# Patient Record
Sex: Female | Born: 1935 | Race: White | Hispanic: No | Smoking: Never smoker
Health system: Southern US, Community
[De-identification: ages and names within clinical notes are randomized; demographics above are authoritative.]

## PROBLEM LIST (undated history)

## (undated) DIAGNOSIS — I82409 Acute embolism and thrombosis of unspecified deep veins of unspecified lower extremity: Secondary | ICD-10-CM

## (undated) DIAGNOSIS — I739 Peripheral vascular disease, unspecified: Secondary | ICD-10-CM

## (undated) DIAGNOSIS — D493 Neoplasm of unspecified behavior of breast: Secondary | ICD-10-CM

## (undated) DIAGNOSIS — I447 Left bundle-branch block, unspecified: Secondary | ICD-10-CM

## (undated) DIAGNOSIS — M8580 Other specified disorders of bone density and structure, unspecified site: Secondary | ICD-10-CM

## (undated) DIAGNOSIS — E785 Hyperlipidemia, unspecified: Secondary | ICD-10-CM

## (undated) DIAGNOSIS — K635 Polyp of colon: Secondary | ICD-10-CM

## (undated) DIAGNOSIS — K2970 Gastritis, unspecified, without bleeding: Secondary | ICD-10-CM

## (undated) DIAGNOSIS — K209 Esophagitis, unspecified without bleeding: Secondary | ICD-10-CM

## (undated) DIAGNOSIS — E559 Vitamin D deficiency, unspecified: Secondary | ICD-10-CM

## (undated) DIAGNOSIS — G8929 Other chronic pain: Secondary | ICD-10-CM

## (undated) DIAGNOSIS — R768 Other specified abnormal immunological findings in serum: Secondary | ICD-10-CM

## (undated) DIAGNOSIS — G4733 Obstructive sleep apnea (adult) (pediatric): Secondary | ICD-10-CM

## (undated) DIAGNOSIS — I10 Essential (primary) hypertension: Secondary | ICD-10-CM

## (undated) DIAGNOSIS — M159 Polyosteoarthritis, unspecified: Secondary | ICD-10-CM

## (undated) DIAGNOSIS — Z7901 Long term (current) use of anticoagulants: Secondary | ICD-10-CM

## (undated) DIAGNOSIS — E7800 Pure hypercholesterolemia, unspecified: Secondary | ICD-10-CM

## (undated) DIAGNOSIS — E782 Mixed hyperlipidemia: Secondary | ICD-10-CM

## (undated) DIAGNOSIS — D509 Iron deficiency anemia, unspecified: Secondary | ICD-10-CM

## (undated) DIAGNOSIS — M1711 Unilateral primary osteoarthritis, right knee: Secondary | ICD-10-CM

## (undated) DIAGNOSIS — Z79899 Other long term (current) drug therapy: Secondary | ICD-10-CM

## (undated) DIAGNOSIS — K5732 Diverticulitis of large intestine without perforation or abscess without bleeding: Secondary | ICD-10-CM

## (undated) HISTORY — DX: Peripheral vascular disease, unspecified: I73.9

## (undated) HISTORY — DX: Polyp of colon: K63.5

## (undated) HISTORY — DX: Vitamin D deficiency, unspecified: E55.9

## (undated) HISTORY — DX: Esophagitis, unspecified without bleeding: K20.90

## (undated) HISTORY — DX: Other chronic pain: G89.29

## (undated) HISTORY — DX: Obstructive sleep apnea (adult) (pediatric): G47.33

## (undated) HISTORY — DX: Polyosteoarthritis, unspecified: M15.9

## (undated) HISTORY — DX: Other long term (current) drug therapy: Z79.899

## (undated) HISTORY — DX: Hyperlipidemia, unspecified: E78.5

## (undated) HISTORY — DX: Iron deficiency anemia, unspecified: D50.9

## (undated) HISTORY — DX: Mixed hyperlipidemia: E78.2

## (undated) HISTORY — DX: Long term (current) use of anticoagulants: Z79.01

## (undated) HISTORY — DX: Other specified disorders of bone density and structure, unspecified site: M85.80

## (undated) HISTORY — DX: Gastritis, unspecified, without bleeding: K29.70

## (undated) HISTORY — DX: Essential (primary) hypertension: I10

## (undated) HISTORY — DX: Other specified abnormal immunological findings in serum: R76.8

## (undated) HISTORY — DX: Neoplasm of unspecified behavior of breast: D49.3

## (undated) HISTORY — DX: Acute embolism and thrombosis of unspecified deep veins of unspecified lower extremity: I82.409

## (undated) HISTORY — DX: Diverticulitis of large intestine without perforation or abscess without bleeding: K57.32

## (undated) HISTORY — DX: Esophagitis, unspecified: K20.9

## (undated) HISTORY — DX: Unilateral primary osteoarthritis, right knee: M17.11

## (undated) HISTORY — DX: Left bundle-branch block, unspecified: I44.7

## (undated) HISTORY — DX: Pure hypercholesterolemia, unspecified: E78.00

---

## 2017-01-12 ENCOUNTER — Other Ambulatory Visit: Payer: Self-pay

## 2017-01-12 DIAGNOSIS — I6529 Occlusion and stenosis of unspecified carotid artery: Secondary | ICD-10-CM

## 2017-01-31 ENCOUNTER — Encounter: Payer: Self-pay | Admitting: Vascular Surgery

## 2017-02-22 ENCOUNTER — Encounter (HOSPITAL_COMMUNITY): Payer: Medicare Other

## 2017-02-22 ENCOUNTER — Encounter: Payer: Medicare Other | Admitting: Vascular Surgery

## 2019-06-18 ENCOUNTER — Emergency Department (HOSPITAL_COMMUNITY)
Admission: EM | Admit: 2019-06-18 | Discharge: 2019-06-19 | Disposition: A | Discharge status: Home / Self Care | Payer: Medicare Other | Attending: Emergency Medicine | Admitting: Emergency Medicine

## 2019-06-18 ENCOUNTER — Emergency Department (HOSPITAL_COMMUNITY): Payer: Medicare Other

## 2019-06-18 ENCOUNTER — Encounter (HOSPITAL_COMMUNITY): Payer: Self-pay | Admitting: Emergency Medicine

## 2019-06-18 ENCOUNTER — Other Ambulatory Visit: Payer: Self-pay

## 2019-06-18 DIAGNOSIS — Z86718 Personal history of other venous thrombosis and embolism: Secondary | ICD-10-CM | POA: Diagnosis not present

## 2019-06-18 DIAGNOSIS — I10 Essential (primary) hypertension: Secondary | ICD-10-CM | POA: Insufficient documentation

## 2019-06-18 DIAGNOSIS — Z79899 Other long term (current) drug therapy: Secondary | ICD-10-CM | POA: Diagnosis not present

## 2019-06-18 DIAGNOSIS — F0390 Unspecified dementia without behavioral disturbance: Secondary | ICD-10-CM | POA: Diagnosis not present

## 2019-06-18 DIAGNOSIS — R4182 Altered mental status, unspecified: Secondary | ICD-10-CM | POA: Diagnosis not present

## 2019-06-18 DIAGNOSIS — R404 Transient alteration of awareness: Secondary | ICD-10-CM | POA: Diagnosis not present

## 2019-06-18 DIAGNOSIS — Z7901 Long term (current) use of anticoagulants: Secondary | ICD-10-CM | POA: Diagnosis not present

## 2019-06-18 DIAGNOSIS — I739 Peripheral vascular disease, unspecified: Secondary | ICD-10-CM | POA: Diagnosis not present

## 2019-06-18 LAB — CBC
HCT: 28.6 % — ABNORMAL LOW (ref 36.0–46.0)
Hemoglobin: 9.1 g/dL — ABNORMAL LOW (ref 12.0–15.0)
MCH: 29.4 pg (ref 26.0–34.0)
MCHC: 31.8 g/dL (ref 30.0–36.0)
MCV: 92.3 fL (ref 80.0–100.0)
Platelets: 312 10*3/uL (ref 150–400)
RBC: 3.1 MIL/uL — ABNORMAL LOW (ref 3.87–5.11)
RDW: 17.6 % — ABNORMAL HIGH (ref 11.5–15.5)
WBC: 10.1 10*3/uL (ref 4.0–10.5)
nRBC: 0 % (ref 0.0–0.2)

## 2019-06-18 LAB — COMPREHENSIVE METABOLIC PANEL
ALT: 15 U/L (ref 0–44)
AST: 24 U/L (ref 15–41)
Albumin: 2.6 g/dL — ABNORMAL LOW (ref 3.5–5.0)
Alkaline Phosphatase: 73 U/L (ref 38–126)
Anion gap: 14 (ref 5–15)
BUN: 25 mg/dL — ABNORMAL HIGH (ref 8–23)
CO2: 24 mmol/L (ref 22–32)
Calcium: 8.6 mg/dL — ABNORMAL LOW (ref 8.9–10.3)
Chloride: 99 mmol/L (ref 98–111)
Creatinine, Ser: 2.19 mg/dL — ABNORMAL HIGH (ref 0.44–1.00)
GFR calc Af Amer: 22 mL/min — ABNORMAL LOW (ref >60)
GFR calc non Af Amer: 19 mL/min — ABNORMAL LOW (ref >60)
Glucose, Bld: 151 mg/dL — ABNORMAL HIGH (ref 70–99)
Potassium: 4 mmol/L (ref 3.5–5.1)
Sodium: 137 mmol/L (ref 135–145)
Total Bilirubin: 0.4 mg/dL (ref 0.3–1.2)
Total Protein: 7.3 g/dL (ref 6.5–8.1)

## 2019-06-18 LAB — I-STAT CHEM 8, ED
BUN: 26 mg/dL — ABNORMAL HIGH (ref 8–23)
Calcium, Ion: 1.09 mmol/L — ABNORMAL LOW (ref 1.15–1.40)
Chloride: 101 mmol/L (ref 98–111)
Creatinine, Ser: 2.3 mg/dL — ABNORMAL HIGH (ref 0.44–1.00)
Glucose, Bld: 147 mg/dL — ABNORMAL HIGH (ref 70–99)
HCT: 29 % — ABNORMAL LOW (ref 36.0–46.0)
Hemoglobin: 9.9 g/dL — ABNORMAL LOW (ref 12.0–15.0)
Potassium: 3.9 mmol/L (ref 3.5–5.1)
Sodium: 138 mmol/L (ref 135–145)
TCO2: 27 mmol/L (ref 22–32)

## 2019-06-18 LAB — DIFFERENTIAL
Abs Immature Granulocytes: 0.03 10*3/uL (ref 0.00–0.07)
Basophils Absolute: 0.1 10*3/uL (ref 0.0–0.1)
Basophils Relative: 1 %
Eosinophils Absolute: 0.1 10*3/uL (ref 0.0–0.5)
Eosinophils Relative: 1 %
Immature Granulocytes: 0 %
Lymphocytes Relative: 25 %
Lymphs Abs: 2.6 10*3/uL (ref 0.7–4.0)
Monocytes Absolute: 0.8 10*3/uL (ref 0.1–1.0)
Monocytes Relative: 8 %
Neutro Abs: 6.5 10*3/uL (ref 1.7–7.7)
Neutrophils Relative %: 65 %

## 2019-06-18 LAB — URINALYSIS, ROUTINE W REFLEX MICROSCOPIC
Bilirubin Urine: NEGATIVE
Glucose, UA: NEGATIVE mg/dL
Hgb urine dipstick: NEGATIVE
Ketones, ur: NEGATIVE mg/dL
Nitrite: NEGATIVE
Protein, ur: 30 mg/dL — AB
Specific Gravity, Urine: 1.01 (ref 1.005–1.030)
pH: 6 (ref 5.0–8.0)

## 2019-06-18 LAB — APTT: aPTT: 30 seconds (ref 24–36)

## 2019-06-18 LAB — CBG MONITORING, ED: Glucose-Capillary: 137 mg/dL — ABNORMAL HIGH (ref 70–99)

## 2019-06-18 LAB — PROTIME-INR
INR: 1.1 (ref 0.8–1.2)
Prothrombin Time: 14.3 seconds (ref 11.4–15.2)

## 2019-06-18 MED ORDER — SODIUM CHLORIDE 0.9% FLUSH: 3.0000 mL | Freq: Once | INTRAVENOUS | Status: DC

## 2019-06-18 MED ORDER — DIVALPROEX SODIUM 250 MG PO DR TAB: 250.0000 mg | DELAYED_RELEASE_TABLET | Freq: Three times a day (TID) | ORAL | 0 refills | Status: AC

## 2019-06-18 MED ORDER — DIVALPROEX SODIUM 250 MG PO DR TAB
250.0000 mg | DELAYED_RELEASE_TABLET | Freq: Once | ORAL | Status: AC
  Administered 2019-06-18: 250 mg via ORAL
  Filled 2019-06-18: qty 1

## 2019-06-18 MED ORDER — CEPHALEXIN 250 MG PO CAPS: 250.0000 mg | ORAL_CAPSULE | Freq: Two times a day (BID) | ORAL | 0 refills | Status: AC

## 2019-06-18 MED ORDER — DIVALPROEX SODIUM 250 MG PO DR TAB: 250.0000 mg | DELAYED_RELEASE_TABLET | Freq: Three times a day (TID) | ORAL | 0 refills | Status: DC

## 2019-06-18 MED ORDER — CEPHALEXIN 250 MG PO CAPS
250.0000 mg | ORAL_CAPSULE | Freq: Once | ORAL | Status: AC
  Administered 2019-06-18: 250 mg via ORAL
  Filled 2019-06-18: qty 1

## 2019-06-18 NOTE — Procedures (Addendum)
Patient Name: Judy Aguilar  MRN: [id number]  Epilepsy Attending: Lora Havens  Referring Physician/Provider: Etta Quill, PA Date: 06/18/2019 Duration: 24.03 mins  Patient history: 86yo F with h/o stroke presented with ams. EEG to evaluate for seizure.  Level of alertness: lethargic  AEDs during EEG study: None  Technical aspects: This EEG study was done with scalp electrodes positioned according to the 10-20 International system of electrode placement. Electrical activity was acquired at a sampling rate of 500Hz  and reviewed with a high frequency filter of 70Hz  and a low frequency filter of 1Hz . EEG data were recorded continuously and digitally stored.   DESCRIPTION: No clear posterior dominant rhythm was seen. EEG showed continuous generalized 3-6Hz  theta-delta slowing, Hyperventilation and photic stimulation were not performed.  ABNORMALITY - Continuous slow, generalized   IMPRESSION: This study is suggestive of moderate diffuse encephalopathy, non specific to etiology. No seizures or definite epileptiform discharges were seen throughout the recording.  [name]

## 2019-06-18 NOTE — Consult Note (Addendum)
Neurology Consultation  Reason for Consult: Code Stroke  Referring Physician: Dr. Sabra Heck  CC: AMS  History is obtained from: EMS  HPI: Judy Aguilar is a 86 y.o. female with past medical history of osteopenia, obstructive sleep apnea, breast neoplasm, hypertension and hyperlipidemia.  She was brought to [facility] as a code stroke secondary to altered mental status.  Unfortunately this is a brand-new patient to the nursing facility and they did not have any significant history of what she is like at baseline.  5 minutes prior to EMS arrival to nursing home facility patient was up and standing around.  Staff had just received her; their limited knowledge of her is that she can talk appropriately with full sentences, is oriented, and able to ambulate by herself as well as "take care of her self" at baseline.  Apparently she was found in a chair, with acute onset of right facial droop and drooling.  EMS was called and brought her to the emergency department as a code stroke.  On arrival patient was nonverbal, tremulous all over, noted to have increased tone with prominent cogwheeling in her bilateral upper extremities.  She was completely nonverbal on arrival and could not follow commands, but was awake.  It was noted that patient did have mucus coming out of her nose and a rhonchorous cough.  LKW: 2423 tpa given?: no, nonfocal exam Premorbid modified Rankin scale (mRS): 1 NIH stroke scale of 11  Past Medical History:  Diagnosis Date  . Chronic pain   . Colon polyp   . Current use of long term anticoagulation   . Diverticulitis of colon   . DVT (deep venous thrombosis) ([place])   . Esophagitis   . Gastritis   . Generalized osteoarthrosis, involving multiple sites   . Helicobacter pylori ab+   . High risk medication use   . Hypercholesterolemia   . Hyperlipidemia   . Hypertension    benign  . Iron deficiency anemia   . LBBB (left bundle branch block)   . Mixed hyperlipidemia    . Neoplasm of breast   . OSA (obstructive sleep apnea)   . Osteoarthritis of right knee   . Osteopenia   . Peripheral arterial disease ([place])   . Vitamin D deficiency     Family History  Problem Relation Age of Onset  . CAD Mother   . Cancer Sister   . Heart disease Brother    Social History:   reports that she has never smoked. She does not have any smokeless tobacco history on file. She reports that she does not drink alcohol or use drugs.  Medications  Current Facility-Administered Medications:  .  sodium chloride flush (NS) 0.9 % injection 3 mL, 3 mL, Intravenous, Once, Lajean Saver, MD  Current Outpatient Medications:  .  esomeprazole (NEXIUM) 40 MG capsule, Take 40 mg daily at 12 noon by mouth., Disp: , Rfl:  .  Evolocumab (REPATHA SURECLICK) 536 MG/ML SOAJ, Inject into the skin., Disp: , Rfl:  .  ezetimibe (ZETIA) 10 MG tablet, Take 10 mg daily by mouth., Disp: , Rfl:  .  guaiFENesin (MUCINEX) 600 MG 12 hr tablet, Take 2 (two) times daily by mouth., Disp: , Rfl:  .  loratadine (CLARITIN) 10 MG tablet, Take 10 mg daily by mouth., Disp: , Rfl:  .  meloxicam (MOBIC) 7.5 MG tablet, Take 7.5 mg daily by mouth., Disp: , Rfl:  .  Menthol, Topical Analgesic, (BIOFREEZE) 4 % GEL, Apply topically., Disp: ,  Rfl:  .  montelukast (SINGULAIR) 10 MG tablet, Take 10 mg at bedtime by mouth., Disp: , Rfl:  .  rosuvastatin (CRESTOR) 20 MG tablet, Take 20 mg daily by mouth., Disp: , Rfl:  .  sucralfate (CARAFATE) 1 g tablet, Take 1 g 4 (four) times daily -  with meals and at bedtime by mouth., Disp: , Rfl:  .  triamterene-hydrochlorothiazide (DYAZIDE) 37.5-25 MG capsule, Take 1 capsule daily by mouth., Disp: , Rfl:   ROS:   Unable to obtain due to altered mental status.   Exam: Current vital signs: There were no vitals taken for this visit. Vital signs in last 24 hours: BP: ()/()  Arterial Line BP: ()/()    Constitutional: Appears well-developed and well-nourished.  Psych: Affect  appropriate to situation Eyes: No scleral injection HENT: No OP obstrucion Head: Normocephalic.  Cardiovascular: Normal rate and regular rhythm.  Respiratory: Effort normal, non-labored breathing GI: Soft.  No distension. There is no tenderness.  Skin: WDI  Neuro: Mental Status: Patient is nonverbal, does not follow commands, has a rhonchorous cough, currently looks encephalopathic. Cranial Nerves: II: Blinks to threat bilaterally. Pupils equal. Unable to assess pupillary reactivity as patient closes eyes to penlight.  III,IV, VI: EOMI without ptosis.  V: Unable to assess VII: Facial movement is grossly symmetric.  VIII: Unable to assess X: Unable to assess XI: Head is midline.  XII: Unable to assess Motor/Sensory: Patient has good strength antigravity in BUE with resting tremor, cogwheeling, increased tone symmetrically. Withdraws from noxious stimuli in all 4 extremities Deep Tendon Reflexes: 2+ and symmetric in the biceps and patellae.  Plantars: Toes are downgoing bilaterally.  Cerebellar: Unable to perform  Labs I have reviewed labs in epic and the results pertinent to this consultation are:   CBC No results found for: WBC, RBC, HGB, HCT, PLT, MCV, MCH, MCHC, RDW, LYMPHSABS, MONOABS, EOSABS, BASOSABS  CMP  No results found for: NA, K, CL, CO2, GLUCOSE, BUN, CREATININE, CALCIUM, PROT, ALBUMIN, AST, ALT, ALKPHOS, BILITOT, GFRNONAA, GFRAA  Lipid Panel  No results found for: CHOL, TRIG, HDL, CHOLHDL, VLDL, LDLCALC, LDLDIRECT   Imaging I have reviewed the images obtained:  CT-scan of the brain-no acute infarct or hemorrhage  MRI examination of the brain-pending  Etta Quill PA-C [facility] [phone] 06/18/2019, 5:46 PM     Assessment: 86 year old female who presented to the ED as a code stroke.  Patient does have a history of right MCA stroke in the past.  On arrival patient is nonverbal with increased tone and resting tremor.   1. Exam: Moving  all 4 extremities with no lateralizing weakness.   2. Patient does at this time look like the overall picture is of an acute encephalopathy.  However given the fact that she has had a stroke in the past differential includes new acute stroke versus possible unwitnessed seizure at the SNF with postictal state. If she has undiagnosed Lewy body dementia, then a severe cognitive fluctuation in conjunction with Parkinsonian motor symptoms is also possible.   Recommendations: -MRI brain.  If positive stroke work-up -EEG stat, this has been ordered  Addendum: -- EEG showed continuous generalized slowing. The study is suggestive of moderate diffuse encephalopathy, non specific to etiology. No seizures or definite epileptiform discharges were seen throughout the recording. -- MRI showed no acute stroke. There is severe chronic small vessel ischemic disease, mildly progressed from the prior MRI including interval infarcts in the right corona radiata. -- Per EDP, as of about 9:00  PM the patient is nearly back to her baseline, as reported by daughter.  -- Given relatively high likelihood of an unwitnessed seizure with postictal state, would start the patient on valproic acid 250 mg po TID. She should have outpatient Neurology follow up in 2-4 weeks with blood draw for CBC, LFTs and ammonia.   I have seen and examined the patient. I have formulated the assessment and recommendations. 86 year old female with acute encephalopathy on presentation, which has now resolved almost to her cognitive baseline. MRI brain without acute stroke. EEG without electrographic seizure. Plan is to start her on valproic acid given relatively high likelihood of an unwitnessed seizure as the precipitating event. Will need outpatient Neurology follow up.  Electronically signed: Dr. Kerney Elbe

## 2019-06-18 NOTE — ED Triage Notes (Signed)
Pt BIB [facility] from [facility] L[id number]. Staff reports pt normally ambulatory and able to communicate. Pt became altered, not following commands and had right facial droop.

## 2019-06-18 NOTE — Progress Notes (Signed)
EEG complete - results pending 

## 2019-06-18 NOTE — ED Notes (Signed)
Attempted to call pt's facility x 2, no answer, no machine to leave message and call back number.

## 2019-06-18 NOTE — Discharge Instructions (Addendum)
Take Depakote 3 times a day. Take antibiotics twice a day as prescribed. Continue taking all other medications as prescribed. Follow-up with the neurologist.  Call the office listed below to set up an appointment. Return to the emergency room with any new, worsening, concerning symptoms.

## 2019-06-18 NOTE — ED Triage Notes (Signed)
Code Stroke cancelled per Dr. Cheral Marker at [facility].

## 2019-06-18 NOTE — ED Notes (Signed)
Ptar called for pt 

## 2019-06-18 NOTE — ED Provider Notes (Signed)
[place] EMERGENCY DEPARTMENT Provider Note   CSN: [id number] Arrival date & time: 06/18/19  1735  An emergency department physician performed an initial assessment on this suspected stroke patient at 1737.  History Chief Complaint  Patient presents with  . Code Stroke    Judy Aguilar is a 86 y.o. female presenting for evaluation of altered mental status and right-sided facial droop.  Level 5 caveat due to altered mental status.  Per EMS, patient was found to be nonverbal, with right sided facial droop and drooling.  EMS was called, code stroke activated.  Per EMS, facility states patient is new to them, so they do not have a good baseline or history.  Apparently patient can talk appropriately in full sentences is able to ambulate and take care of herself. I called patient's nursing home, was unable to make contact.  Additional history obtained from patient son, Judy Aguilar.  He states patient was recently hospitalized in [facility] for altered mental status.  During that time, she was nonverbal for the most part, selectively choosing to speak words.  This did not change significantly by the time of discharge.   Additional history obtained from chart review.  Per [facility] notes, patient was admitted from 2/22-2/26.  She was admitted for altered mental status.  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  During admission, patient was found to have multifocal PVCs and tachycardia, so metoprolol was started this improved.  Additionally, patient was evaluated by neuro during hospitalization.  EEG showed encephalopathy, but no seizures.  At time of discharge, patient oriented x1 and following commands.  Patient currently taking potassium, Lasix, gabapentin, rosuvastatin, Plavix, iron, metoprolol, Singulair,  Protonix.  The following medications were stopped during her last hospitalization: amlodipine, hydralazine, isosorbide dinitrate, Claritin.  HPI     Past Medical History:  Diagnosis Date  . Chronic pain   . Colon polyp   . Current use of long term anticoagulation   . Diverticulitis of colon   . DVT (deep venous thrombosis) ([place])   . Esophagitis   . Gastritis   . Generalized osteoarthrosis, involving multiple sites   . Helicobacter pylori ab+   . High risk medication use   . Hypercholesterolemia   . Hyperlipidemia   . Hypertension    benign  . Iron deficiency anemia   . LBBB (left bundle branch block)   . Mixed hyperlipidemia   . Neoplasm of breast   . OSA (obstructive sleep apnea)   . Osteoarthritis of right knee   . Osteopenia   . Peripheral arterial disease ([place])   . Vitamin D deficiency     There are no problems to display for this patient.   History reviewed. No pertinent surgical history.   OB History   No obstetric history on file.     Family History  Problem Relation Age of Onset  . CAD Mother   . Cancer Sister   . Heart disease Brother     Social History   Tobacco Use  . Smoking status: Never Smoker  Substance Use Topics  . Alcohol use: No  . Drug use: No    Home Medications Prior to Admission medications   Medication Sig Start Date End Date Taking? Authorizing Provider  cephALEXin (KEFLEX) 250 MG capsule Take 1 capsule (250 mg total) by mouth 2 (two) times daily for 5 days.  06/18/19 06/23/19  Nat Lowenthal, PA-C  divalproex (DEPAKOTE) 250 MG DR tablet Take 1 tablet (250 mg total) by mouth 3 (three) times daily. 06/18/19 07/18/19  Larue Lightner, PA-C  esomeprazole (NEXIUM) 40 MG capsule Take 40 mg daily at 12 noon by mouth.    [provider]  Evolocumab (REPATHA SURECLICK) 062 MG/ML SOAJ Inject into the skin.    [provider]  ezetimibe (ZETIA) 10 MG tablet Take 10 mg daily by mouth.    [provider]    guaiFENesin (MUCINEX) 600 MG 12 hr tablet Take 2 (two) times daily by mouth.    [provider]  loratadine (CLARITIN) 10 MG tablet Take 10 mg daily by mouth.    [provider]  meloxicam (MOBIC) 7.5 MG tablet Take 7.5 mg daily by mouth.    [provider]  Menthol, Topical Analgesic, (BIOFREEZE) 4 % GEL Apply topically.    [provider]  montelukast (SINGULAIR) 10 MG tablet Take 10 mg at bedtime by mouth.    [provider]  rosuvastatin (CRESTOR) 20 MG tablet Take 20 mg daily by mouth.    [provider]  sucralfate (CARAFATE) 1 g tablet Take 1 g 4 (four) times daily -  with meals and at bedtime by mouth.    [provider]  triamterene-hydrochlorothiazide (DYAZIDE) 37.5-25 MG capsule Take 1 capsule daily by mouth.    [provider]    Allergies    Salicylates  Review of Systems   Review of Systems  Unable to perform ROS: Dementia    Physical Exam Updated Vital Signs BP (!) 116/52   Pulse 71   Temp (!) 97 F (36.1 C) (Temporal)   Resp 16   SpO2 100%   Physical Exam Vitals and nursing note reviewed.  Constitutional:      Appearance: She is well-developed.     Comments: Chronically ill-appearing.  Opens eyes spontaneously.  Will nod yes or no and response to questions, follows basic commands  HENT:     Head: Normocephalic and atraumatic.  Eyes:     Extraocular Movements: Extraocular movements intact.     Conjunctiva/sclera: Conjunctivae normal.     Pupils: Pupils are equal, round, and reactive to light.  Cardiovascular:     Rate and Rhythm: Normal rate and regular rhythm.     Pulses: Normal pulses.  Pulmonary:     Effort: Pulmonary effort is normal. No respiratory distress.     Breath sounds: Normal breath sounds. No wheezing.  Abdominal:     General: There is no distension.     Palpations: Abdomen is soft. There is no mass.     Tenderness: There is no abdominal tenderness. There is no  guarding or rebound.  Musculoskeletal:     Cervical back: Normal range of motion and neck supple.     Comments: Will raise arms on command.  Will raise right leg on command, but does not move left leg.  Skin:    General: Skin is warm and dry.     Capillary Refill: Capillary refill takes less than 2 seconds.  Neurological:     GCS: GCS eye subscore is 4. GCS verbal subscore is 2. GCS motor subscore is 6.     ED Results / Procedures / Treatments   Labs (all labs ordered are listed, but only abnormal results are displayed) Labs Reviewed  CBC - Abnormal; Notable for the following components:      Result Value   RBC  3.10 (*)    Hemoglobin 9.1 (*)    HCT 28.6 (*)    RDW 17.6 (*)    All other components within normal limits  COMPREHENSIVE METABOLIC PANEL - Abnormal; Notable for the following components:   Glucose, Bld 151 (*)    BUN 25 (*)    Creatinine, Ser 2.19 (*)    Calcium 8.6 (*)    Albumin 2.6 (*)    GFR calc non Af Amer 19 (*)    GFR calc Af Amer 22 (*)    All other components within normal limits  URINALYSIS, ROUTINE W REFLEX MICROSCOPIC - Abnormal; Notable for the following components:   Protein, ur 30 (*)    Leukocytes,Ua MODERATE (*)    Bacteria, UA RARE (*)    All other components within normal limits  CBG MONITORING, ED - Abnormal; Notable for the following components:   Glucose-Capillary 137 (*)    All other components within normal limits  I-STAT CHEM 8, ED - Abnormal; Notable for the following components:   BUN 26 (*)    Creatinine, Ser 2.30 (*)    Glucose, Bld 147 (*)    Calcium, Ion 1.09 (*)    Hemoglobin 9.9 (*)    HCT 29.0 (*)    All other components within normal limits  URINE CULTURE  PROTIME-INR  APTT  DIFFERENTIAL  CBG MONITORING, ED    EKG EKG Interpretation  Date/Time:  Tuesday June 18 2019 18:11:06 EDT Ventricular Rate:  82 PR Interval:    QRS Duration: 215 QT Interval:  412 QTC Calculation: 482 R Axis:   6 Text  Interpretation: Sinus rhythm Ventricular premature complex IVCD, consider atypical LBBB Artifact in lead(s) II III aVR aVF V1 V2 V4 V5 V6 No old tracing to compare Confirmed by Noemi Chapel [phone]) on 06/18/2019 6:25:54 PM   Radiology EEG  Result Date: 06/18/2019 Lora Havens, MD     06/18/2019  9:05 PM Patient Name: Judy Aguilar MRN: [id number] Epilepsy Attending: Lora Havens Referring Physician/Provider: Etta Quill, PA Date: 06/18/2019 Duration: 23 mins Patient history: 86yo F with h/o stroke presented with ams. EEG to evaluate for seizure. Level of alertness: lethargic AEDs during EEG study: None Technical aspects: This EEG study was done with scalp electrodes positioned according to the 10-20 International system of electrode placement. Electrical activity was acquired at a sampling rate of 500Hz  and reviewed with a high frequency filter of 70Hz  and a low frequency filter of 1Hz . EEG data were recorded continuously and digitally stored. DESCRIPTION: No clear posterior dominant rhythm was seen. EEG showed continuous generalized 3-6Hz  theta-delta slowing, Hyperventilation and photic stimulation were not performed. ABNORMALITY - Continuous slow, generalized IMPRESSION: This study is suggestive of moderate diffuse encephalopathy, non specific to etiology. No seizures or definite epileptiform discharges were seen throughout the recording. Lora Havens   MR BRAIN WO CONTRAST  Result Date: 06/18/2019 CLINICAL DATA:  Aphasia and right facial droop. EXAM: MRI HEAD WITHOUT CONTRAST TECHNIQUE: Multiplanar, multiecho pulse sequences of the brain and surrounding structures were obtained without intravenous contrast. COMPARISON:  Head CT 06/18/2019 and MRI 10/13/2018 FINDINGS: Brain: There is new patchy trace diffusion weighted signal hyperintensity in the right corona radiata with normal to increased ADC most consistent with nonacute ischemia, and there are discrete foci of encephalomalacia in this  region consistent with interval chronic infarcts. No definite acute infarct is identified. There are 2 chronic microhemorrhages medially in the right parietal lobe, 1 of which is new from the  prior MRI. A chronic microhemorrhage in the right frontal operculum is unchanged. A chronic hemorrhagic infarct is again seen involving the left thalamus and internal capsule. There also small chronic infarcts in the right thalamus and left occipital lobe. Confluent T2 hyperintensities in the cerebral white matter bilaterally have mildly progressed from the prior MRI and are nonspecific but compatible with severe chronic small vessel ischemic disease. Mild chronic small vessel ischemia is noted in the pons. There is moderately advanced central predominant cerebral atrophy. No mass, midline shift, or extra-axial fluid collection is identified. A chronically expanded partially empty sella is unchanged from the prior MRI. Vascular: Major intracranial vascular flow voids are preserved. Skull and upper cervical spine: Unremarkable bone marrow signal. Sinuses/Orbits: Bilateral cataract extraction. Paranasal sinuses and mastoid air cells are clear. Other: None. IMPRESSION: 1. No acute intracranial abnormality. 2. Severe chronic small vessel ischemic disease, mildly progressed from the prior MRI including interval infarcts in the right corona radiata. Electronically Signed   By: Logan Bores M.D.   On: 06/18/2019 19:14   CT HEAD CODE STROKE WO CONTRAST  Result Date: 06/18/2019 CLINICAL DATA:  Code stroke.  Aphasia and right facial droop. EXAM: CT HEAD WITHOUT CONTRAST TECHNIQUE: Contiguous axial images were obtained from the base of the skull through the vertex without intravenous contrast. COMPARISON:  04/28/2019 FINDINGS: Brain: There is no evidence of acute infarct, intracranial hemorrhage, mass, midline shift, or extra-axial fluid collection. Confluent hypodensities in the cerebral white matter bilaterally are unchanged and  nonspecific but compatible with extensive chronic small vessel ischemic disease. Small chronic infarcts are again seen in the medial left occipital lobe, thalami, and left internal capsule. Ventricular enlargement is unchanged and attributed to central predominant cerebral atrophy. Vascular: Calcified atherosclerosis at the skull base. No hyperdense vessel. Skull: No fracture or suspicious osseous lesion. Sinuses/Orbits: Paranasal sinuses and mastoid air cells are clear. Bilateral cataract extraction. Other: None. ASPECTS [facility] Stroke Program Early CT Score) - Ganglionic level infarction (caudate, lentiform nuclei, internal capsule, insula, M1-M3 cortex): 7 - Supraganglionic infarction (M4-M6 cortex): 3 Total score (0-10 with 10 being normal): 10 IMPRESSION: 1. No evidence of acute intracranial abnormality. 2. ASPECTS is 10. 3. Extensive chronic small vessel ischemic disease. These results were communicated to Dr. Cheral Marker at 6:01 pm on 06/18/2019 by text page via the [facility] messaging system. Electronically Signed   By: Logan Bores M.D.   On: 06/18/2019 18:01    Procedures Procedures (including critical care time)  Medications Ordered in ED Medications  sodium chloride flush (NS) 0.9 % injection 3 mL (has no administration in time range)  divalproex (DEPAKOTE) DR tablet 250 mg (250 mg Oral Given 06/18/19 2220)  cephALEXin (KEFLEX) capsule 250 mg (250 mg Oral Given 06/18/19 2324)    ED Course  I have reviewed the triage vital signs and the nursing notes.  Pertinent labs & imaging results that were available during my care of the patient were reviewed by me and considered in my medical decision making (see chart for details).    MDM Rules/Calculators/A&P                      Patient presenting for evaluation of altered mental status.  On my exam, patient able to answer yes/no questions, and follows simple commands.  However per EMS, she was not responding initially.  CT head negative for acute  findings.  Discussed with Dr. Cheral Marker from neurology, low suspicion for stroke, recommends MRI to rule out.  Will  also obtain stat EEG, as there is concern that this may be a seizure.  Will obtain labs to assess for underlying metabolic abnormality.  When compared to recent hospitalization, labs are at baseline.  Creatinine 2.1, baseline is 1.9.  Hemoglobin low at 9, but this is patient's baseline.  MRI negative for acute findings.  Shows interval increase in previously demonstrated strokes.  EEG pending.  On reassessment, patient is more alert and oriented.  She is answering questions appropriately.  Daughter is at bedside, states patient is at her baseline mental status.  UA with signs of possible UTI. In the setting of AMS, will tx with abx.   EEG shows encephalopathy, but no seizure-like activity.  Discussed with neurology.  As there is no findings of acute stroke, he still has high suspicion for seizure.  Patient unable to get Keppra due to creatinine.  Recommends Depakote 250 mg 3 times daily and outpatient neurology follow-up.  I discussed plan with patient and daughter, both are agreeable.  At this time, patient appears safe for discharge.  Return precautions given.  Patient states she understands and agrees to plan.  Final Clinical Impression(s) / ED Diagnoses Final diagnoses:  Transient alteration of awareness    Rx / DC Orders ED Discharge Orders         Ordered    divalproex (DEPAKOTE) 250 MG DR tablet  3 times daily,   Status:  Discontinued     06/18/19 2213    cephALEXin (KEFLEX) 250 MG capsule  2 times daily     06/18/19 2312    divalproex (DEPAKOTE) 250 MG DR tablet  3 times daily     06/18/19 2313           Franchot Heidelberg, PA-C 06/18/19 2347    Noemi Chapel, MD 06/19/19 1204

## 2019-06-18 NOTE — ED Notes (Signed)
Pt conversing with daughter, answering questions appropriately at this time.

## 2019-06-18 NOTE — ED Provider Notes (Signed)
Medical screening examination/treatment/procedure(s) were conducted as a shared visit with non-physician practitioner(s) and myself.  I personally evaluated the patient during the encounter.  Clinical Impression:   Final diagnoses:  Transient alteration of awareness    This patient is a 86 year old female presenting with altered mental status, recently discharged from another hospital after having altered mental status, had a very broad work-up in the hospital, it is unclear exactly what she was diagnosed with.  On my exam the patient is able to follow some simple commands, does not talk very much, seems to move both arms and both legs.  Possible slight facial droop.  Neuro hospitalist involved in making recommendations.   Noemi Chapel, MD 06/19/19 [phone]

## 2019-06-21 LAB — URINE CULTURE: Culture: >=100000 — AB

## 2019-06-22 ENCOUNTER — Telehealth: Payer: Self-pay | Admitting: *Deleted

## 2019-06-22 NOTE — Progress Notes (Signed)
ED Antimicrobial Stewardship Positive Culture Follow Up   Judy Aguilar is an 86 y.o. female who presented to [facility] on 06/18/2019 with a chief complaint of  Chief Complaint  Patient presents with  . Code Stroke    Recent Results (from the past 720 hour(s))  Urine culture     Status: Abnormal   Collection Time: 06/18/19 10:25 PM   Specimen: Urine, Clean Catch  Result Value Ref Range Status   Specimen Description URINE, CLEAN CATCH  Final   Special Requests   Final    NONE Performed at [facility], [place] [address] [place], [place] [address]    Culture >=100,000 COLONIES/mL ENTEROCOCCUS FAECALIS (A)  Final   Report Status 06/21/2019 FINAL  Final   Organism ID, Bacteria ENTEROCOCCUS FAECALIS (A)  Final      Susceptibility   Enterococcus faecalis - MIC*    AMPICILLIN <=2 SENSITIVE Sensitive     NITROFURANTOIN <=16 SENSITIVE Sensitive     VANCOMYCIN 1 SENSITIVE Sensitive     * >=100,000 COLONIES/mL ENTEROCOCCUS FAECALIS    [x]  Treated with Keflex, organism resistant to prescribed antimicrobial  New antibiotic prescription: Amoxil 500mg  PO every 8 hours x 5d  ED Provider: Eustaquio Maize, Reece Leader 06/22/2019, 1:13 PM Clinical Pharmacist Monday - Friday phone -  [phone] Saturday - Sunday phone - [phone]

## 2019-06-22 NOTE — Telephone Encounter (Signed)
Post ED Visit - Positive Culture Follow-up: Successful Patient Follow-Up  Culture assessed and recommendations reviewed by:  []  Elenor Quinones, Pharm.D. []  Heide Guile, Pharm.D., BCPS AQ-ID []  Parks Neptune, Pharm.D., BCPS []  Alycia Rossetti, Pharm.D., BCPS []  [place], Pharm.D., BCPS, AAHIVP []  Legrand Como, Pharm.D., BCPS, AAHIVP []  Salome Arnt, PharmD, BCPS []  Johnnette Gourd, PharmD, BCPS []  Hughes Better, PharmD, BCPS []  Leeroy Cha, PharmD  Positive urine culture  []  Patient discharged without antimicrobial prescription and treatment is now indicated [x]  Organism is resistant to prescribed ED discharge antimicrobial []  Patient with positive blood cultures  Changes discussed with ED provider: Eustaquio Maize, [facility] New antibiotic prescription Amoxil 500mg  PO q 8 hours x 5 days Faxed to [facility] where pt is a resident Fax [phone]  [place] ([place]), date 06/22/2019, time [place] 06/22/2019, 11:56 AM

## 2019-07-03 ENCOUNTER — Observation Stay (HOSPITAL_COMMUNITY): Payer: Medicare Other

## 2019-07-03 ENCOUNTER — Other Ambulatory Visit: Payer: Self-pay

## 2019-07-03 ENCOUNTER — Inpatient Hospital Stay (HOSPITAL_COMMUNITY)
Admission: EM | Admit: 2019-07-03 | Discharge: 2019-07-09 | DRG: 641 | Disposition: A | Discharge status: Skilled Nursing Facility | Payer: Medicare Other | Source: Skilled Nursing Facility | Attending: Family Medicine | Admitting: Family Medicine

## 2019-07-03 DIAGNOSIS — F0390 Unspecified dementia without behavioral disturbance: Secondary | ICD-10-CM

## 2019-07-03 DIAGNOSIS — E860 Dehydration: Principal | ICD-10-CM | POA: Diagnosis present

## 2019-07-03 DIAGNOSIS — D631 Anemia in chronic kidney disease: Secondary | ICD-10-CM | POA: Diagnosis present

## 2019-07-03 DIAGNOSIS — Z20822 Contact with and (suspected) exposure to covid-19: Secondary | ICD-10-CM | POA: Diagnosis present

## 2019-07-03 DIAGNOSIS — Z8673 Personal history of transient ischemic attack (TIA), and cerebral infarction without residual deficits: Secondary | ICD-10-CM

## 2019-07-03 DIAGNOSIS — E559 Vitamin D deficiency, unspecified: Secondary | ICD-10-CM | POA: Diagnosis present

## 2019-07-03 DIAGNOSIS — R64 Cachexia: Secondary | ICD-10-CM | POA: Diagnosis present

## 2019-07-03 DIAGNOSIS — I739 Peripheral vascular disease, unspecified: Secondary | ICD-10-CM | POA: Diagnosis present

## 2019-07-03 DIAGNOSIS — Z809 Family history of malignant neoplasm, unspecified: Secondary | ICD-10-CM

## 2019-07-03 DIAGNOSIS — Z886 Allergy status to analgesic agent status: Secondary | ICD-10-CM

## 2019-07-03 DIAGNOSIS — S2231XA Fracture of one rib, right side, initial encounter for closed fracture: Secondary | ICD-10-CM | POA: Diagnosis present

## 2019-07-03 DIAGNOSIS — R531 Weakness: Secondary | ICD-10-CM | POA: Diagnosis not present

## 2019-07-03 DIAGNOSIS — Z993 Dependence on wheelchair: Secondary | ICD-10-CM

## 2019-07-03 DIAGNOSIS — Z66 Do not resuscitate: Secondary | ICD-10-CM

## 2019-07-03 DIAGNOSIS — Z791 Long term (current) use of non-steroidal anti-inflammatories (NSAID): Secondary | ICD-10-CM

## 2019-07-03 DIAGNOSIS — B379 Candidiasis, unspecified: Secondary | ICD-10-CM | POA: Diagnosis not present

## 2019-07-03 DIAGNOSIS — Z8249 Family history of ischemic heart disease and other diseases of the circulatory system: Secondary | ICD-10-CM

## 2019-07-03 DIAGNOSIS — E782 Mixed hyperlipidemia: Secondary | ICD-10-CM | POA: Diagnosis present

## 2019-07-03 DIAGNOSIS — Z6826 Body mass index (BMI) 26.0-26.9, adult: Secondary | ICD-10-CM

## 2019-07-03 DIAGNOSIS — Z888 Allergy status to other drugs, medicaments and biological substances status: Secondary | ICD-10-CM

## 2019-07-03 DIAGNOSIS — R29810 Facial weakness: Secondary | ICD-10-CM | POA: Diagnosis present

## 2019-07-03 DIAGNOSIS — N179 Acute kidney failure, unspecified: Secondary | ICD-10-CM | POA: Diagnosis present

## 2019-07-03 DIAGNOSIS — Z853 Personal history of malignant neoplasm of breast: Secondary | ICD-10-CM

## 2019-07-03 DIAGNOSIS — E876 Hypokalemia: Secondary | ICD-10-CM | POA: Diagnosis present

## 2019-07-03 DIAGNOSIS — G4733 Obstructive sleep apnea (adult) (pediatric): Secondary | ICD-10-CM | POA: Diagnosis present

## 2019-07-03 DIAGNOSIS — R4182 Altered mental status, unspecified: Secondary | ICD-10-CM | POA: Diagnosis present

## 2019-07-03 DIAGNOSIS — I5022 Chronic systolic (congestive) heart failure: Secondary | ICD-10-CM | POA: Diagnosis present

## 2019-07-03 DIAGNOSIS — M1711 Unilateral primary osteoarthritis, right knee: Secondary | ICD-10-CM | POA: Diagnosis present

## 2019-07-03 DIAGNOSIS — K219 Gastro-esophageal reflux disease without esophagitis: Secondary | ICD-10-CM | POA: Diagnosis present

## 2019-07-03 DIAGNOSIS — R1310 Dysphagia, unspecified: Secondary | ICD-10-CM | POA: Diagnosis present

## 2019-07-03 DIAGNOSIS — T502X5A Adverse effect of carbonic-anhydrase inhibitors, benzothiadiazides and other diuretics, initial encounter: Secondary | ICD-10-CM | POA: Diagnosis not present

## 2019-07-03 DIAGNOSIS — F0150 Vascular dementia without behavioral disturbance: Secondary | ICD-10-CM | POA: Diagnosis present

## 2019-07-03 DIAGNOSIS — I130 Hypertensive heart and chronic kidney disease with heart failure and stage 1 through stage 4 chronic kidney disease, or unspecified chronic kidney disease: Secondary | ICD-10-CM | POA: Diagnosis present

## 2019-07-03 DIAGNOSIS — R32 Unspecified urinary incontinence: Secondary | ICD-10-CM | POA: Diagnosis present

## 2019-07-03 DIAGNOSIS — M8580 Other specified disorders of bone density and structure, unspecified site: Secondary | ICD-10-CM | POA: Diagnosis present

## 2019-07-03 DIAGNOSIS — Z7902 Long term (current) use of antithrombotics/antiplatelets: Secondary | ICD-10-CM

## 2019-07-03 DIAGNOSIS — G9340 Encephalopathy, unspecified: Secondary | ICD-10-CM | POA: Diagnosis present

## 2019-07-03 DIAGNOSIS — I493 Ventricular premature depolarization: Secondary | ICD-10-CM | POA: Diagnosis present

## 2019-07-03 DIAGNOSIS — R627 Adult failure to thrive: Secondary | ICD-10-CM | POA: Diagnosis present

## 2019-07-03 DIAGNOSIS — R569 Unspecified convulsions: Secondary | ICD-10-CM | POA: Diagnosis present

## 2019-07-03 DIAGNOSIS — G8929 Other chronic pain: Secondary | ICD-10-CM | POA: Diagnosis present

## 2019-07-03 DIAGNOSIS — Z515 Encounter for palliative care: Secondary | ICD-10-CM

## 2019-07-03 DIAGNOSIS — Z7189 Other specified counseling: Secondary | ICD-10-CM

## 2019-07-03 DIAGNOSIS — N184 Chronic kidney disease, stage 4 (severe): Secondary | ICD-10-CM | POA: Diagnosis present

## 2019-07-03 DIAGNOSIS — I447 Left bundle-branch block, unspecified: Secondary | ICD-10-CM | POA: Diagnosis present

## 2019-07-03 LAB — BASIC METABOLIC PANEL
Anion gap: 18 — ABNORMAL HIGH (ref 5–15)
BUN: 67 mg/dL — ABNORMAL HIGH (ref 8–23)
CO2: 28 mmol/L (ref 22–32)
Calcium: 8.6 mg/dL — ABNORMAL LOW (ref 8.9–10.3)
Chloride: 94 mmol/L — ABNORMAL LOW (ref 98–111)
Creatinine, Ser: 3.8 mg/dL — ABNORMAL HIGH (ref 0.44–1.00)
GFR calc Af Amer: 11 mL/min — ABNORMAL LOW (ref >60)
GFR calc non Af Amer: 10 mL/min — ABNORMAL LOW (ref >60)
Glucose, Bld: 99 mg/dL (ref 70–99)
Potassium: 2.9 mmol/L — ABNORMAL LOW (ref 3.5–5.1)
Sodium: 140 mmol/L (ref 135–145)

## 2019-07-03 LAB — CBC WITH DIFFERENTIAL/PLATELET
Abs Immature Granulocytes: 0.15 10*3/uL — ABNORMAL HIGH (ref 0.00–0.07)
Basophils Absolute: 0 10*3/uL (ref 0.0–0.1)
Basophils Relative: 0 %
Eosinophils Absolute: 0 10*3/uL (ref 0.0–0.5)
Eosinophils Relative: 0 %
HCT: 29.7 % — ABNORMAL LOW (ref 36.0–46.0)
Hemoglobin: 9.8 g/dL — ABNORMAL LOW (ref 12.0–15.0)
Immature Granulocytes: 1 %
Lymphocytes Relative: 6 %
Lymphs Abs: 1.3 10*3/uL (ref 0.7–4.0)
MCH: 29.4 pg (ref 26.0–34.0)
MCHC: 33 g/dL (ref 30.0–36.0)
MCV: 89.2 fL (ref 80.0–100.0)
Monocytes Absolute: 1.8 10*3/uL — ABNORMAL HIGH (ref 0.1–1.0)
Monocytes Relative: 8 %
Neutro Abs: 19 10*3/uL — ABNORMAL HIGH (ref 1.7–7.7)
Neutrophils Relative %: 85 %
Platelets: 232 10*3/uL (ref 150–400)
RBC: 3.33 MIL/uL — ABNORMAL LOW (ref 3.87–5.11)
RDW: 16.1 % — ABNORMAL HIGH (ref 11.5–15.5)
WBC: 22.3 10*3/uL — ABNORMAL HIGH (ref 4.0–10.5)
nRBC: 0 % (ref 0.0–0.2)

## 2019-07-03 LAB — HEPATIC FUNCTION PANEL
ALT: 11 U/L (ref 0–44)
AST: 24 U/L (ref 15–41)
Albumin: 2.6 g/dL — ABNORMAL LOW (ref 3.5–5.0)
Alkaline Phosphatase: 79 U/L (ref 38–126)
Bilirubin, Direct: <0.1 mg/dL (ref 0.0–0.2)
Total Bilirubin: 0.7 mg/dL (ref 0.3–1.2)
Total Protein: 7.9 g/dL (ref 6.5–8.1)

## 2019-07-03 LAB — BRAIN NATRIURETIC PEPTIDE: B Natriuretic Peptide: 742.9 pg/mL — ABNORMAL HIGH (ref 0.0–100.0)

## 2019-07-03 LAB — MAGNESIUM: Magnesium: 2.6 mg/dL — ABNORMAL HIGH (ref 1.7–2.4)

## 2019-07-03 LAB — MRSA PCR SCREENING: MRSA by PCR: NEGATIVE

## 2019-07-03 LAB — HEMOGLOBIN A1C
Hgb A1c MFr Bld: 6.3 % — ABNORMAL HIGH (ref 4.8–5.6)
Mean Plasma Glucose: 134.11 mg/dL

## 2019-07-03 LAB — PHOSPHORUS: Phosphorus: 3.9 mg/dL (ref 2.5–4.6)

## 2019-07-03 LAB — VALPROIC ACID LEVEL: Valproic Acid Lvl: 58 ug/mL (ref 50.0–100.0)

## 2019-07-03 LAB — SARS CORONAVIRUS 2 (TAT 6-24 HRS): SARS Coronavirus 2: NEGATIVE

## 2019-07-03 LAB — TSH: TSH: 2.079 u[IU]/mL (ref 0.350–4.500)

## 2019-07-03 MED ORDER — LACTATED RINGERS IV BOLUS
1000.0000 mL | Freq: Once | INTRAVENOUS | Status: AC
  Administered 2019-07-03: 12:00:00 1000 mL via INTRAVENOUS

## 2019-07-03 MED ORDER — POTASSIUM CHLORIDE CRYS ER 20 MEQ PO TBCR
60.0000 meq | EXTENDED_RELEASE_TABLET | Freq: Once | ORAL | Status: AC
  Administered 2019-07-03: 60 meq via ORAL
  Filled 2019-07-03: qty 3

## 2019-07-03 MED ORDER — DIVALPROEX SODIUM 250 MG PO DR TAB
250.0000 mg | DELAYED_RELEASE_TABLET | Freq: Three times a day (TID) | ORAL | Status: DC
  Administered 2019-07-03: 250 mg via ORAL
  Filled 2019-07-03 (×2): qty 1

## 2019-07-03 NOTE — Progress Notes (Signed)
I saw this patient today 07/03/19 before 5 PM. I have discussed management plan with the resident and a sign off message to Dr. Andria Frames the new attending. I will cosign H&P once it is completed.

## 2019-07-03 NOTE — ED Provider Notes (Signed)
[facility] EMERGENCY DEPARTMENT Provider Note   CSN: [id number] Arrival date & time: 07/03/19  1017     History Chief Complaint  Patient presents with  . Weakness    Over 1 week     Judy Aguilar is a 86 y.o. female.  HPI  65yF with decline in functional status. It sounds like progressively worsening over past weeks? Pt says she just has no energy. She doesn't feel like getting up. She is not sure why. Denies any acute pain. No dyspnea. Appetite poor. No respiratory complaints. No fever. She is unsure about possible recent med changes.  History primarily obtained from review of records. Recently admitted from 2/22-2/26 at [facility].  She was admitted for altered mental status.  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  During admission, patient was found to have multifocal PVCs and tachycardia, so metoprolol was started this improved.  Additionally, patient was evaluated by neuro during hospitalization.  EEG showed encephalopathy, but no seizures.  Seen in ED at [facility] on 06/19/19 as a Code Stroke. Not following commands for EMS but was by the time she arrived to the ER. Work-up then included CT and MRI w/o acute findings and EEG showing encephalopathy. Neurology still had high suspicion for possible seizure and recommended depakote 250 mg TID.   Past Medical History:  Diagnosis Date  . Chronic pain   . Colon polyp   . Current use of long term anticoagulation   . Diverticulitis of colon   . DVT (deep venous thrombosis) ([place])   . Esophagitis   . Gastritis   . Generalized osteoarthrosis, involving multiple sites   . Helicobacter pylori ab+   . High risk medication use   . Hypercholesterolemia   . Hyperlipidemia   . Hypertension    benign  . Iron deficiency anemia   . LBBB (left bundle branch block)   .  Mixed hyperlipidemia   . Neoplasm of breast   . OSA (obstructive sleep apnea)   . Osteoarthritis of right knee   . Osteopenia   . Peripheral arterial disease ([place])   . Vitamin D deficiency     There are no problems to display for this patient.   No past surgical history on file.   OB History   No obstetric history on file.     Family History  Problem Relation Age of Onset  . CAD Mother   . Cancer Sister   . Heart disease Brother     Social History   Tobacco Use  . Smoking status: Never Smoker  Substance Use Topics  . Alcohol use: No  . Drug use: No    Home Medications Prior to Admission medications   Medication Sig Start Date End Date Taking? Authorizing Provider  divalproex (DEPAKOTE) 250 MG DR tablet Take 1 tablet (250 mg total) by mouth 3 (three) times daily. 06/18/19 07/18/19  Caccavale, Sophia, PA-C  esomeprazole (NEXIUM) 40 MG capsule Take 40 mg daily at 12 noon by mouth.    [provider]  Evolocumab (REPATHA SURECLICK) 510 MG/ML SOAJ Inject into the skin.    [provider]  ezetimibe (ZETIA) 10 MG tablet Take 10 mg daily by mouth.    [provider]  guaiFENesin (MUCINEX) 600 MG 12 hr tablet Take 2 (two) times daily by mouth.    [provider]  loratadine (CLARITIN)  10 MG tablet Take 10 mg daily by mouth.    [provider]  meloxicam (MOBIC) 7.5 MG tablet Take 7.5 mg daily by mouth.    [provider]  Menthol, Topical Analgesic, (BIOFREEZE) 4 % GEL Apply topically.    [provider]  montelukast (SINGULAIR) 10 MG tablet Take 10 mg at bedtime by mouth.    [provider]  rosuvastatin (CRESTOR) 20 MG tablet Take 20 mg daily by mouth.    [provider]  sucralfate (CARAFATE) 1 g tablet Take 1 g 4 (four) times daily -  with meals and at bedtime by mouth.    [provider]  triamterene-hydrochlorothiazide (DYAZIDE) 37.5-25 MG capsule Take 1 capsule daily by mouth.     [provider]    Allergies    Salicylates  Review of Systems   Review of Systems   Level 5 caveat because of dementia.   Physical Exam Updated Vital Signs BP (!) 109/50 (BP Location: Right Arm) Comment: Simultaneous filing. User may not have seen previous data.  Pulse 68   Temp 98.6 F (37 C)   Resp 17 Comment: Simultaneous filing. User may not have seen previous data.  SpO2 98% Comment: Simultaneous filing. User may not have seen previous data.  Physical Exam Vitals and nursing note reviewed.  Constitutional:      General: She is not in acute distress.    Appearance: She is well-developed.     Comments: Laying in bed.  Drowsy, but no acute distress.  HENT:     Head: Normocephalic and atraumatic.  Eyes:     General:        Right eye: No discharge.        Left eye: No discharge.     Conjunctiva/sclera: Conjunctivae normal.  Cardiovascular:     Rate and Rhythm: Normal rate and regular rhythm.     Heart sounds: Normal heart sounds. No murmur. No friction rub. No gallop.   Pulmonary:     Effort: Pulmonary effort is normal. No respiratory distress.     Breath sounds: Normal breath sounds.  Abdominal:     General: There is no distension.     Palpations: Abdomen is soft.     Tenderness: There is no abdominal tenderness.  Musculoskeletal:        General: No tenderness.     Cervical back: Neck supple.  Skin:    General: Skin is warm and dry.  Neurological:     Mental Status: She is alert.  Psychiatric:     Comments: Opens eyes to voice and answers simple questions.  Following simple commands.  No focal motor deficits. Able to tell me she is in the hospital. Disoriented to time.      ED Results / Procedures / Treatments   Labs (all labs ordered are listed, but only abnormal results are displayed) Labs Reviewed  CBC WITH DIFFERENTIAL/PLATELET - Abnormal; Notable for the following components:      Result Value   WBC 22.3 (*)    RBC 3.33 (*)    Hemoglobin  9.8 (*)    HCT 29.7 (*)    RDW 16.1 (*)    Neutro Abs 19.0 (*)    Monocytes Absolute 1.8 (*)    Abs Immature Granulocytes 0.15 (*)    All other components within normal limits  BASIC METABOLIC PANEL - Abnormal; Notable for the following components:   Potassium 2.9 (*)    Chloride 94 (*)  BUN 67 (*)    Creatinine, Ser 3.80 (*)    Calcium 8.6 (*)    GFR calc non Af Amer 10 (*)    GFR calc Af Amer 11 (*)    Anion gap 18 (*)    All other components within normal limits  MAGNESIUM - Abnormal; Notable for the following components:   Magnesium 2.6 (*)    All other components within normal limits  HEPATIC FUNCTION PANEL - Abnormal; Notable for the following components:   Albumin 2.6 (*)    All other components within normal limits  BRAIN NATRIURETIC PEPTIDE - Abnormal; Notable for the following components:   B Natriuretic Peptide 742.9 (*)    All other components within normal limits  HEMOGLOBIN A1C - Abnormal; Notable for the following components:   Hgb A1c MFr Bld 6.3 (*)    All other components within normal limits  BASIC METABOLIC PANEL - Abnormal; Notable for the following components:   Chloride 97 (*)    BUN 64 (*)    Creatinine, Ser 3.52 (*)    Calcium 8.3 (*)    GFR calc non Af Amer 11 (*)    GFR calc Af Amer 12 (*)    All other components within normal limits  CBC WITH DIFFERENTIAL/PLATELET - Abnormal; Notable for the following components:   WBC 19.0 (*)    RBC 3.21 (*)    Hemoglobin 9.6 (*)    HCT 28.6 (*)    RDW 16.2 (*)    Neutro Abs 15.7 (*)    Monocytes Absolute 1.8 (*)    All other components within normal limits  MAGNESIUM - Abnormal; Notable for the following components:   Magnesium 2.5 (*)    All other components within normal limits  FOLATE RBC - Abnormal; Notable for the following components:   Hematocrit 28.7 (*)    All other components within normal limits  VITAMIN B12 - Abnormal; Notable for the following components:   Vitamin B-12 1,183 (*)     All other components within normal limits  URINALYSIS, ROUTINE W REFLEX MICROSCOPIC - Abnormal; Notable for the following components:   APPearance HAZY (*)    Hgb urine dipstick SMALL (*)    Protein, ur 100 (*)    Leukocytes,Ua SMALL (*)    Bacteria, UA FEW (*)    All other components within normal limits  IRON AND TIBC - Abnormal; Notable for the following components:   Iron 27 (*)    TIBC 248 (*)    All other components within normal limits  FERRITIN - Abnormal; Notable for the following components:   Ferritin 508 (*)    All other components within normal limits  CBC - Abnormal; Notable for the following components:   WBC 19.7 (*)    RBC 3.49 (*)    Hemoglobin 10.2 (*)    HCT 31.2 (*)    RDW 16.0 (*)    All other components within normal limits  BASIC METABOLIC PANEL - Abnormal; Notable for the following components:   Potassium 3.3 (*)    Glucose, Bld 61 (*)    BUN 56 (*)    Creatinine, Ser 3.19 (*)    Calcium 8.5 (*)    GFR calc non Af Amer 12 (*)    GFR calc Af Amer 14 (*)    Anion gap 17 (*)    All other components within normal limits  MAGNESIUM - Abnormal; Notable for the following components:   Magnesium 2.5 (*)  All other components within normal limits  BASIC METABOLIC PANEL - Abnormal; Notable for the following components:   BUN 48 (*)    Creatinine, Ser 2.79 (*)    Calcium 8.0 (*)    GFR calc non Af Amer 14 (*)    GFR calc Af Amer 16 (*)    All other components within normal limits  CBC WITH DIFFERENTIAL/PLATELET - Abnormal; Notable for the following components:   WBC 17.4 (*)    RBC 3.14 (*)    Hemoglobin 9.1 (*)    HCT 27.8 (*)    RDW 16.0 (*)    Neutro Abs 14.0 (*)    Monocytes Absolute 1.6 (*)    Abs Immature Granulocytes 0.12 (*)    All other components within normal limits  BASIC METABOLIC PANEL - Abnormal; Notable for the following components:   Potassium 3.4 (*)    BUN 44 (*)    Creatinine, Ser 2.46 (*)    Calcium 8.4 (*)    GFR calc non  Af Amer 17 (*)    GFR calc Af Amer 19 (*)    All other components within normal limits  CBC - Abnormal; Notable for the following components:   WBC 17.8 (*)    RBC 3.38 (*)    Hemoglobin 10.1 (*)    HCT 30.8 (*)    RDW 16.0 (*)    All other components within normal limits  SARS CORONAVIRUS 2 (TAT 6-24 HRS)  URINE CULTURE  MRSA PCR SCREENING  VALPROIC ACID LEVEL  TSH  PHOSPHORUS  HIV ANTIBODY (ROUTINE TESTING W REFLEX)  RPR  AMMONIA  MAGNESIUM    EKG EKG Interpretation  Date/Time:  Wednesday July 03 2019 09:09:03 EDT Ventricular Rate:  66 PR Interval:    QRS Duration: 155 QT Interval:  474 QTC Calculation: 497 R Axis:   -35 Text Interpretation: Sinus rhythm Ventricular premature complex LVH with secondary repolarization abnormality Borderline prolonged QT interval Artifact in lead(s) I II III aVR aVL aVF V1 V2 V6 Confirmed by Virgel Manifold [phone]) on 07/03/2019 1:55:41 PM   Radiology No results found. EEG  Result Date: 06/18/2019 Lora Havens, MD     06/19/2019  8:40 AM Patient Name: Oceane Fosse MRN: [id number] Epilepsy Attending: Lora Havens Referring Physician/Provider: Etta Quill, PA Date: 06/18/2019 Duration: 24.03 mins Patient history: 86yo F with h/o stroke presented with ams. EEG to evaluate for seizure. Level of alertness: lethargic AEDs during EEG study: None Technical aspects: This EEG study was done with scalp electrodes positioned according to the 10-20 International system of electrode placement. Electrical activity was acquired at a sampling rate of 500Hz  and reviewed with a high frequency filter of 70Hz  and a low frequency filter of 1Hz . EEG data were recorded continuously and digitally stored. DESCRIPTION: No clear posterior dominant rhythm was seen. EEG showed continuous generalized 3-6Hz  theta-delta slowing, Hyperventilation and photic stimulation were not performed. ABNORMALITY - Continuous slow, generalized IMPRESSION: This study is suggestive of  moderate diffuse encephalopathy, non specific to etiology. No seizures or definite epileptiform discharges were seen throughout the recording. Lora Havens   X-ray chest PA and lateral  Result Date: 07/03/2019 CLINICAL DATA:  Female with altered mental status, unresponsive. Declining over the past week. EXAM: CHEST - 2 VIEW COMPARISON:  Portable chest 04/28/2019 and earlier. FINDINGS: AP and lateral views of the chest. Chronic cardiomegaly. But resolved pulmonary vascular congestion since February. No pneumothorax, pulmonary edema or consolidation. Probable small pleural effusions, present in February.  Visualized tracheal air column is within normal limits. Chronic postoperative changes to the right chest wall. There is a mildly displaced fracture of the right anterior 9th rib. No other No acute osseous abnormality identified. Abdominal Calcified aortic atherosclerosis. Negative visible bowel gas pattern. IMPRESSION: 1. Cardiomegaly with small chronic pleural effusions. But resolved interstitial edema since February. 2. Mildly displaced right anterior 9th rib fracture. 3.  Aortic Atherosclerosis (ICD10-I70.0). Electronically Signed   By: Genevie Ann M.D.   On: 07/03/2019 20:30   CT HEAD WO CONTRAST  Result Date: 07/03/2019 CLINICAL DATA:  Focal neurologic deficit, stroke suspected EXAM: CT HEAD WITHOUT CONTRAST TECHNIQUE: Contiguous axial images were obtained from the base of the skull through the vertex without intravenous contrast. COMPARISON:  MRI 06/18/2019 FINDINGS: Brain: Numerous remote appearing lacunar infarcts in the bilateral basal ganglia are similar to comparison exam. No evidence of acute infarction, hemorrhage, hydrocephalus, extra-axial collection or mass lesion/mass effect. Marked prominence of the ventricles, cisterns and sulci compatible with advanced parenchymal volume loss. Confluent areas of white matter hypoattenuation are most compatible with chronic microvascular angiopathy.  Vascular: Atherosclerotic calcification of the carotid siphons and intradural vertebral arteries. No hyperdense vessel. Skull: Benign hyperostosis frontalis interna is mild. No calvarial fracture or suspicious osseous lesion. No scalp swelling or hematoma. Sinuses/Orbits: Minimal mural thickening in the right frontal sinuses. Remaining paranasal sinuses are predominantly clear. Mastoid air cells are well aerated. Orbital structures are unremarkable aside from prior lens extractions. Other: Patient is edentulous. IMPRESSION: 1. No acute intracranial abnormality. If there is persisting clinical concern for acute infarction, MRI is more sensitive and specific for early features of ischemia. 2. Numerous remote appearing lacunar infarcts in the bilateral basal ganglia are similar to comparison exam. 3. Advanced parenchymal volume loss and chronic microvascular angiopathy. Electronically Signed   By: Lovena Le M.D.   On: 07/03/2019 18:41   MR BRAIN WO CONTRAST  Result Date: 06/18/2019 CLINICAL DATA:  Aphasia and right facial droop. EXAM: MRI HEAD WITHOUT CONTRAST TECHNIQUE: Multiplanar, multiecho pulse sequences of the brain and surrounding structures were obtained without intravenous contrast. COMPARISON:  Head CT 06/18/2019 and MRI 10/13/2018 FINDINGS: Brain: There is new patchy trace diffusion weighted signal hyperintensity in the right corona radiata with normal to increased ADC most consistent with nonacute ischemia, and there are discrete foci of encephalomalacia in this region consistent with interval chronic infarcts. No definite acute infarct is identified. There are 2 chronic microhemorrhages medially in the right parietal lobe, 1 of which is new from the prior MRI. A chronic microhemorrhage in the right frontal operculum is unchanged. A chronic hemorrhagic infarct is again seen involving the left thalamus and internal capsule. There also small chronic infarcts in the right thalamus and left occipital  lobe. Confluent T2 hyperintensities in the cerebral white matter bilaterally have mildly progressed from the prior MRI and are nonspecific but compatible with severe chronic small vessel ischemic disease. Mild chronic small vessel ischemia is noted in the pons. There is moderately advanced central predominant cerebral atrophy. No mass, midline shift, or extra-axial fluid collection is identified. A chronically expanded partially empty sella is unchanged from the prior MRI. Vascular: Major intracranial vascular flow voids are preserved. Skull and upper cervical spine: Unremarkable bone marrow signal. Sinuses/Orbits: Bilateral cataract extraction. Paranasal sinuses and mastoid air cells are clear. Other: None. IMPRESSION: 1. No acute intracranial abnormality. 2. Severe chronic small vessel ischemic disease, mildly progressed from the prior MRI including interval infarcts in the right corona radiata. Electronically Signed  By: Logan Bores M.D.   On: 06/18/2019 19:14   CT HEAD CODE STROKE WO CONTRAST  Result Date: 06/18/2019 CLINICAL DATA:  Code stroke.  Aphasia and right facial droop. EXAM: CT HEAD WITHOUT CONTRAST TECHNIQUE: Contiguous axial images were obtained from the base of the skull through the vertex without intravenous contrast. COMPARISON:  04/28/2019 FINDINGS: Brain: There is no evidence of acute infarct, intracranial hemorrhage, mass, midline shift, or extra-axial fluid collection. Confluent hypodensities in the cerebral white matter bilaterally are unchanged and nonspecific but compatible with extensive chronic small vessel ischemic disease. Small chronic infarcts are again seen in the medial left occipital lobe, thalami, and left internal capsule. Ventricular enlargement is unchanged and attributed to central predominant cerebral atrophy. Vascular: Calcified atherosclerosis at the skull base. No hyperdense vessel. Skull: No fracture or suspicious osseous lesion. Sinuses/Orbits: Paranasal sinuses and  mastoid air cells are clear. Bilateral cataract extraction. Other: None. ASPECTS [facility] Stroke Program Early CT Score) - Ganglionic level infarction (caudate, lentiform nuclei, internal capsule, insula, M1-M3 cortex): 7 - Supraganglionic infarction (M4-M6 cortex): 3 Total score (0-10 with 10 being normal): 10 IMPRESSION: 1. No evidence of acute intracranial abnormality. 2. ASPECTS is 10. 3. Extensive chronic small vessel ischemic disease. These results were communicated to Dr. Cheral Marker at 6:01 pm on 06/18/2019 by text page via the [facility] messaging system. Electronically Signed   By: Logan Bores M.D.   On: 06/18/2019 18:01   Procedures Procedures (including critical care time)  Medications Ordered in ED Medications - No data to display  ED Course  I have reviewed the triage vital signs and the nursing notes.  Pertinent labs & imaging results that were available during my care of the patient were reviewed by me and considered in my medical decision making (see chart for details).    MDM Rules/Calculators/A&P                     86 year old female decreased mental status.  AKA noted.  IV fluids.  Pretty significant leukocytosis.  She is afebrile though.  She has no specific complaints aside from feeling generally tired.  UA still pending.  Recent evaluations for strokelike symptoms/altered mental status.  Concern for possible seizure on prior neurology assessment.  She could potentially be postictal?  Recently started on Depakote.  Level is fine.  She is remained very drowsy through her emergency room stay.  Will admit for ongoing hydration.  I would not be surprised if she additionally has a UTI.  [phone] Josph Macho (Son) Final Clinical Impression(s) / ED Diagnoses Final diagnoses:  Dehydration  AKI (acute kidney injury) ([place])  Altered mental status    Rx / DC Orders ED Discharge Orders    None       Virgel Manifold, MD 07/08/19 [phone]

## 2019-07-03 NOTE — H&P (Addendum)
[facility] Admission History and Physical Service Pager: [phone]  Patient name: Judy Aguilar Medical record number: [id number] Date of birth: 06-19-1926 Age: 86 y.o. Gender: female  Primary Care Provider: Sandi Mariscal, MD Consultants: None Code Status: DNR/I Preferred Emergency Contact: Mathews Robinsons [phone]  Chief Complaint: fatigue  Assessment and Plan: Judy Aguilar is a 86 y.o. female presenting with mental decline and weakness. 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 intake over the last 2 weeks. She was found to have a potassium of 2.9, creatinine of 3.8 (baseline 2.0) and a leukocytosis of 22.3.  Unclear etiology for gradual decline at this time and suspect multifactorial in setting of recent stroke on 2/7, baseline dementia, anemia (Hgb 9.8), poor nutrition, dehydration and electrolyte derangements. Infection is possible given leukocytosis with neutrophilic shift, however lack of any infectious symptoms or fever is reassuring. UA pending. CXR negative for infection and lung exam with fine bibasilar crackles that is consistent with chronic bilateral pleural effusions. Some concern for new stroke however CT head negative for acute intracranial abnormality but notable for numerous remote lacunar infarcts in the bilateral basal ganglia similar to previous exam. Since her most recent stroke on 2/7, she has been hospitalized 3 times with a similar presentation. She has had extensive work up including CT head, echo, EEG with no clear etiology determined.  Patient was started on Depakote on 3/30  (unable to take Keppra due to Cr) at last ED encounter for high suspicion for seizure, although EEG at that time revealed encephalopathy, but no seizure-like activity. Appears patient had a very similar presentation and neurological exam at that time. On admission patient is alert and oriented to self only, understands and follows commands. Other than generalized weakness and slight left-sided facial droop neurological exam was unremarkable. Baseline is difficult to ascern from family/SNF, however we do know she is wheelchair bound and can feed herself.  Breakthrough seizure is possible, can consider repeat EEG/neuro cx, however reassuring that valproic acid level WNL. TSH WNL. Considered NPH however given she had a very similar presentation on 3/30 with no MRI findings at that time, opted to hold off. At this time, will follow up labs, gently hydrate patient given her HFrEF, replete her electrolytes and continue to monitor overnight.  -Admit to Med Tele, Attending Dr Hollace Hayward - f/u BNP, LFT's, Phos labs - repeat EKG in am - Repeat BMP and CBC in am - V/S per unit - Neuro signs q4h - NPO until mental status improved, then soft diet - PT/OT/Speech eval - Incentive spirometer q2h while awake - s/p 1L LR in ED + 60 mEq Kdur  - follow up repeat PM BMP tonight to monitor K - daily weights, I/O's - Daughter has opted to make patient DNR - consider palliative consult   AKI Cr 3.80 on admission.  (Baseline ~2.1-2.3). Suspect prerenal in the setting of dehydration and diuretic use. Received 1L LR bolus in the ED -Continue continue to monitor -gently hydration given EF 20-25% -oral hydration when able to tolerated po -consider renal labs if no improvement  Hypokalemia Potassium 2.9 on admission.  Was given 93mEq KCL.  Unclear etiology  but likely poor po intake, or diuretic use. -BMP in am - f/u PM BMP  Severe HFrEF ECHO 04/29/2019: EF 20-25%, mild LVH, severe global hypokinesis of LV, severely  increased LA volume, mild RV dilation. Pt was on Lasix 40mg  BID, Isordil 30mg  TID, Lopressor 25mg  BID, Crestor 40mg  - s/p 1L LR in ED -Daily weights -Strict I's&O's -Monitor fluid status -Hold home medications given soft blood pressures, will resume once BP tolerates  Leukocytosis WBC 61.6 with neutrophilic 07.3.  Patient is afebrile and no source of infection. - Continue to monitor - follow up UA +/- obtain urine culture - CBC in am  Anemia Hbg 9.8 on admission.  Patient has history of iron deficiency anemia.  Not on any supplements. No signs of bleeding. -Continue to monitor  HTN SBP 104-138.  Home medications Hydralazine 10mg  Q8h, Amlodipine 5mg , Isordil 30mg , Metoprolol 50mg  Q -Hold home medications and continue when BP allows -Continue to monitor  OSA Unknown if uses CPAP at night. -Continue to monitor resp status  PAD Right MCA CVA on 04/28/19 with left hemibody weakness and left facial droop felt to be 2/2 cryptogenic etiology started on plavix 75mg  QD and Crestor 40mg  QD. Had thrombectomy with reperfusion of area and improvement in symptoms. Discharged to SNF for further PT/OT. SLP at that time recommended ground diet with full range of liquids. Zio patch provided at discharge to evaluate for underlying arrhythmia as her stroke etiology is unknown. Was supposed to follow up with cardiology outpatient but this was postponed until further notice.  - consider cards consult to evaluate Zio patch  Right Rib Fracture: Incidental findings. CXR notable for a mildly displaced right anterior 9th rib fracture. Denies any pain. - consider having ortho weigh in  FEN/GI:  -NPO  Prophylaxis:  -SCD's, plan to start Lovenox once CT head negative  Disposition: Med Tele, Attending Dr. Gwendlyn Deutscher  History of Present Illness:  Judy Aguilar is a 86 y.o. female presenting with slow decline in health with decreased ability to perform ADLs. Level 5 Caviat due to dementia. Patient is oriented to  self only.   Per chart review recently admitted from 2/22-2/26 at [facility]. She was admitted for altered mental status. History of heart failure, last EF 20%, previous TIA, right-sided MCA infarct 02/10/2021that is post lobectomy on Plavix, CKD, hypertension, hyperlipidemia, GERD, breast cancer status postmastectomy. During admission, patient was found to have multifocal PVCs and tachycardia, so metoprolol was started this improved. Additionally, patient was evaluated by neuro during hospitalization. EEG showed encephalopathy, but no seizures.   Seen in ED at [facility] on 06/19/19 as a Code Stroke. Not following commands for EMS but was by the time she arrived to the ER. Work-up then included CT and MRI w/o acute findings and EEG showing encephalopathy. Neurology still had high suspicion for possible seizure and recommended depakote 250 mg TID.   Spoke to nursing home [facility]. Notes she has only been at nursing home for 3-4 weeks. She notes that she was having a change in her mental status. Notes that she normally is alert and oriented to place and self. At baseline she is wheel chair bound. She is incontinent. Unable to obtain remainder of history as I was placed on hold for >30 minutes  Spoke to daughter, Ms. Mathews Robinsons. She is able to converse and feed herself. Last time she saw her mom was around [place] and she was laughing and visiting with her grandchildren. As of 2 weeks ago she was A&Ox3.  She feels like her mom may be giving up. She notes that her brother (patient's son) visited her yesterday and she was shaking and cold yesterday. She had some difficulty swallowing. Daughter notes that she was unable to walk after her last stroke on 3/30. She was able to use her arms without difficulty. Denied any slurred speech or facial droop as of 2 weeks ago.  Last seizure was around the 30th of March. However daughter is unsure of what kind of seizures she had. At this time she was transferred from  facility in [place] to [place].   Last Labs: CHOL 284 (H) 04/28/2019  HDL 66 04/28/2019  LDL 194 (H) 04/28/2019  TRIG 118 04/28/2019   HBA1C 5.5 04/28/2019   TSH 3.695 04/28/2019   CTH (2/7): Hyperdense appearance of the distal right M1 segment MRI (2/8): Multiple small foci of acute infarcts R MCA territory Vessel Imaging CTA (2/7): Proximal right M2 branch occlusion with abrupt  cut off. CTP 2/7): Mismastch 103 cc, Core infarct 4 cc Cardiovascular Diagnostics Telemetry/EKG did not show evidence of arrhythmia:  TTE (2/8): LVEF 20%; no shunt, global hypokinesis   3/30:  EEG: IMPRESSION: This study is suggestive of moderate diffuse encephalopathy, non specific to etiology. No seizures or definite epileptiform discharges were seen throughout the recording.  MRI Brain:  IMPRESSION: 1. No acute intracranial abnormality. 2. Severe chronic small vessel ischemic disease, mildly progressed from the prior MRI including interval infarcts in the right corona radiata.   CT Head WO:  IMPRESSION: 1. No evidence of acute intracranial abnormality. 2. ASPECTS is 10. 3. Extensive chronic small vessel ischemic disease.   Review Of Systems: Per HPI with the following additions:   Review of Systems  Unable to perform ROS: Dementia  Denies any symptoms such as cough, congestion, chest pain, abdominal pain.    Patient Active Problem List   Diagnosis Date Noted  . Generalized weakness 07/03/2019  . Altered mental status 07/03/2019  . AKI (acute kidney injury) ([place])   . Dehydration     Past Medical History: Past Medical History:  Diagnosis Date  . Chronic pain   . Colon polyp   . Current use of long term anticoagulation   . Diverticulitis of colon   . DVT (deep venous thrombosis) ([place])   . Esophagitis   . Gastritis   . Generalized osteoarthrosis, involving multiple sites   . Helicobacter pylori ab+   . High risk medication use   . Hypercholesterolemia   . Hyperlipidemia   .  Hypertension    benign  . Iron deficiency anemia   . LBBB (left bundle branch block)   . Mixed hyperlipidemia   . Neoplasm of breast   . OSA (obstructive sleep apnea)   . Osteoarthritis of right knee   . Osteopenia   . Peripheral arterial disease ([place])   . Vitamin D deficiency     Past Surgical History: No past surgical history on file.  Social History: Social History   Tobacco Use  . Smoking status: Never Smoker  Substance Use Topics  . Alcohol use: No  . Drug use: No   Additional social history: Limited due to current mental status Please also refer to relevant sections of EMR.  Family History: Family History  Problem Relation Age of Onset  . CAD Mother   . Cancer Sister   . Heart disease Brother    Allergies and Medications: Allergies  Allergen Reactions  . Lisinopril     Other reaction(s): Cough (  ALLERGY/intolerance)  . Salicylates Nausea And Vomiting   No current facility-administered medications on file prior to encounter.   Current Outpatient Medications on File Prior to Encounter  Medication Sig Dispense Refill  . acetaminophen (TYLENOL) 325 MG tablet Take 650 mg by mouth every 6 (six) hours as needed for mild pain.    Marland Kitchen amLODipine (NORVASC) 5 MG tablet Take 5 mg by mouth daily.    . clopidogrel (PLAVIX) 75 MG tablet Take 75 mg by mouth daily.    . divalproex (DEPAKOTE) 250 MG DR tablet Take 1 tablet (250 mg total) by mouth 3 (three) times daily. 90 tablet 0  . furosemide (LASIX) 40 MG tablet Take 40 mg by mouth 2 (two) times daily.    Marland Kitchen gabapentin (NEURONTIN) 600 MG tablet Take 600 mg by mouth 3 (three) times daily.    . hydrALAZINE (APRESOLINE) 10 MG tablet Take 10 mg by mouth 3 (three) times daily.    . isosorbide dinitrate (ISORDIL) 30 MG tablet Take 30 mg by mouth 3 (three) times daily.    Marland Kitchen loratadine (CLARITIN) 10 MG tablet Take 10 mg daily by mouth.    . metoprolol tartrate (LOPRESSOR) 50 MG tablet Take 50 mg by mouth 2 (two) times daily.    .  montelukast (SINGULAIR) 10 MG tablet Take 10 mg by mouth daily.     . pantoprazole (PROTONIX) 40 MG tablet Take 40 mg by mouth daily.    . rosuvastatin (CRESTOR) 20 MG tablet Take 20 mg daily by mouth.    . triamterene-hydrochlorothiazide (DYAZIDE) 37.5-25 MG capsule Take 1 capsule daily by mouth.    Marland Kitchen amoxicillin (AMOXIL) 500 MG capsule Take 500 mg by mouth in the morning and at bedtime. For 5 days for UTI      Objective: BP 138/89 (BP Location: Right Arm)   Pulse 77   Temp 98.6 F (37 C) (Oral)   Resp 19   SpO2 100%  Exam: General: Alert and oriented to person, no apparent distress  Eyes: PEERLA ENTM: No pharyengeal erythema Neck: nontender Cardiovascular: RRR with no murmurs noted Respiratory: Bibasilar fine crackles with good air entry, no increased work of breathing  Gastrointestinal: Bowel sounds present. No abdominal pain MSK: Upper extremity strength 5/5 bilaterally, Lower extremity strength 5/5 bilaterally  Derm: No rashes noted Neuro: Left facial droop, weakness in upper and lower extremities(pt wheelchair bound) and baseline dementia   Labs and Imaging: CBC BMET  Recent Labs  Lab 07/03/19 1022  WBC 22.3*  HGB 9.8*  HCT 29.7*  PLT 232   Recent Labs  Lab 07/03/19 1022  NA 140  K 2.9*  CL 94*  CO2 28  BUN 67*  CREATININE 3.80*  GLUCOSE 99  CALCIUM 8.6*     EKG: SR with ventricular premature complex, unchanged from prior Valproic acid:  58  X-ray chest PA and lateral  Result Date: 07/03/2019 CLINICAL DATA:  Female with altered mental status, unresponsive. Declining over the past week. EXAM: CHEST - 2 VIEW COMPARISON:  Portable chest 04/28/2019 and earlier. FINDINGS: AP and lateral views of the chest. Chronic cardiomegaly. But resolved pulmonary vascular congestion since February. No pneumothorax, pulmonary edema or consolidation. Probable small pleural effusions, present in February. Visualized tracheal air column is within normal limits. Chronic  postoperative changes to the right chest wall. There is a mildly displaced fracture of the right anterior 9th rib. No other No acute osseous abnormality identified. Abdominal Calcified aortic atherosclerosis. Negative visible bowel gas pattern. IMPRESSION: 1. Cardiomegaly  with small chronic pleural effusions. But resolved interstitial edema since February. 2. Mildly displaced right anterior 9th rib fracture. 3.  Aortic Atherosclerosis (ICD10-I70.0). Electronically Signed   By: Genevie Ann M.D.   On: 07/03/2019 20:30   CT HEAD WO CONTRAST  Result Date: 07/03/2019 CLINICAL DATA:  Focal neurologic deficit, stroke suspected EXAM: CT HEAD WITHOUT CONTRAST TECHNIQUE: Contiguous axial images were obtained from the base of the skull through the vertex without intravenous contrast. COMPARISON:  MRI 06/18/2019 FINDINGS: Brain: Numerous remote appearing lacunar infarcts in the bilateral basal ganglia are similar to comparison exam. No evidence of acute infarction, hemorrhage, hydrocephalus, extra-axial collection or mass lesion/mass effect. Marked prominence of the ventricles, cisterns and sulci compatible with advanced parenchymal volume loss. Confluent areas of white matter hypoattenuation are most compatible with chronic microvascular angiopathy. Vascular: Atherosclerotic calcification of the carotid siphons and intradural vertebral arteries. No hyperdense vessel. Skull: Benign hyperostosis frontalis interna is mild. No calvarial fracture or suspicious osseous lesion. No scalp swelling or hematoma. Sinuses/Orbits: Minimal mural thickening in the right frontal sinuses. Remaining paranasal sinuses are predominantly clear. Mastoid air cells are well aerated. Orbital structures are unremarkable aside from prior lens extractions. Other: Patient is edentulous. IMPRESSION: 1. No acute intracranial abnormality. If there is persisting clinical concern for acute infarction, MRI is more sensitive and specific for early features of  ischemia. 2. Numerous remote appearing lacunar infarcts in the bilateral basal ganglia are similar to comparison exam. 3. Advanced parenchymal volume loss and chronic microvascular angiopathy. Electronically Signed   By: Lovena Le M.D.   On: 07/03/2019 18:41   Carollee Leitz, MD 07/03/2019, 9:25 PM PGY-1, [place] Intern pager: [phone], text pages welcome  Upper Level Addendum:  I have seen and evaluated this patient along with Dr. Carollee Leitz and reviewed the above note, making necessary revisions in green.   Mina Marble, D.O. 07/03/2019, 11:32 PM PGY-2, [place]

## 2019-07-03 NOTE — ED Triage Notes (Signed)
From [place] per [facility] over the last week pt has had a gradual decline. Decrease in ability to do ADLS. Pt is oriented to self and birthday. Pt has no complaints. Facility requested her to be sent out.

## 2019-07-03 NOTE — Plan of Care (Signed)

## 2019-07-03 NOTE — ED Notes (Signed)
Swallow eval placed due to pt not eating any food/ taking bites and having a seemingly difficult time swallowing liquids. Hx of dysphagia.

## 2019-07-04 DIAGNOSIS — R569 Unspecified convulsions: Secondary | ICD-10-CM

## 2019-07-04 DIAGNOSIS — G9340 Encephalopathy, unspecified: Secondary | ICD-10-CM | POA: Diagnosis present

## 2019-07-04 DIAGNOSIS — B379 Candidiasis, unspecified: Secondary | ICD-10-CM | POA: Diagnosis not present

## 2019-07-04 DIAGNOSIS — S2231XA Fracture of one rib, right side, initial encounter for closed fracture: Secondary | ICD-10-CM | POA: Diagnosis present

## 2019-07-04 DIAGNOSIS — I5022 Chronic systolic (congestive) heart failure: Secondary | ICD-10-CM | POA: Diagnosis present

## 2019-07-04 DIAGNOSIS — F0390 Unspecified dementia without behavioral disturbance: Secondary | ICD-10-CM | POA: Diagnosis not present

## 2019-07-04 DIAGNOSIS — R531 Weakness: Secondary | ICD-10-CM | POA: Diagnosis not present

## 2019-07-04 DIAGNOSIS — G8929 Other chronic pain: Secondary | ICD-10-CM | POA: Diagnosis present

## 2019-07-04 DIAGNOSIS — E559 Vitamin D deficiency, unspecified: Secondary | ICD-10-CM | POA: Diagnosis present

## 2019-07-04 DIAGNOSIS — G4733 Obstructive sleep apnea (adult) (pediatric): Secondary | ICD-10-CM | POA: Diagnosis present

## 2019-07-04 DIAGNOSIS — N179 Acute kidney failure, unspecified: Secondary | ICD-10-CM | POA: Diagnosis present

## 2019-07-04 DIAGNOSIS — I447 Left bundle-branch block, unspecified: Secondary | ICD-10-CM | POA: Diagnosis present

## 2019-07-04 DIAGNOSIS — Z66 Do not resuscitate: Secondary | ICD-10-CM | POA: Diagnosis present

## 2019-07-04 DIAGNOSIS — E782 Mixed hyperlipidemia: Secondary | ICD-10-CM | POA: Diagnosis present

## 2019-07-04 DIAGNOSIS — N184 Chronic kidney disease, stage 4 (severe): Secondary | ICD-10-CM | POA: Diagnosis present

## 2019-07-04 DIAGNOSIS — F0150 Vascular dementia without behavioral disturbance: Secondary | ICD-10-CM | POA: Diagnosis not present

## 2019-07-04 DIAGNOSIS — I739 Peripheral vascular disease, unspecified: Secondary | ICD-10-CM | POA: Diagnosis present

## 2019-07-04 DIAGNOSIS — Z7189 Other specified counseling: Secondary | ICD-10-CM | POA: Diagnosis not present

## 2019-07-04 DIAGNOSIS — M1711 Unilateral primary osteoarthritis, right knee: Secondary | ICD-10-CM | POA: Diagnosis present

## 2019-07-04 DIAGNOSIS — T502X5A Adverse effect of carbonic-anhydrase inhibitors, benzothiadiazides and other diuretics, initial encounter: Secondary | ICD-10-CM | POA: Diagnosis not present

## 2019-07-04 DIAGNOSIS — Z20822 Contact with and (suspected) exposure to covid-19: Secondary | ICD-10-CM | POA: Diagnosis present

## 2019-07-04 DIAGNOSIS — R64 Cachexia: Secondary | ICD-10-CM | POA: Diagnosis present

## 2019-07-04 DIAGNOSIS — K219 Gastro-esophageal reflux disease without esophagitis: Secondary | ICD-10-CM | POA: Diagnosis present

## 2019-07-04 DIAGNOSIS — Z515 Encounter for palliative care: Secondary | ICD-10-CM | POA: Diagnosis not present

## 2019-07-04 DIAGNOSIS — I493 Ventricular premature depolarization: Secondary | ICD-10-CM | POA: Diagnosis present

## 2019-07-04 DIAGNOSIS — I130 Hypertensive heart and chronic kidney disease with heart failure and stage 1 through stage 4 chronic kidney disease, or unspecified chronic kidney disease: Secondary | ICD-10-CM | POA: Diagnosis present

## 2019-07-04 DIAGNOSIS — M8580 Other specified disorders of bone density and structure, unspecified site: Secondary | ICD-10-CM | POA: Diagnosis present

## 2019-07-04 DIAGNOSIS — E860 Dehydration: Secondary | ICD-10-CM | POA: Diagnosis not present

## 2019-07-04 DIAGNOSIS — R4182 Altered mental status, unspecified: Secondary | ICD-10-CM | POA: Diagnosis not present

## 2019-07-04 LAB — URINE CULTURE: Culture: NO GROWTH

## 2019-07-04 LAB — CBC WITH DIFFERENTIAL/PLATELET
Abs Immature Granulocytes: 0.05 10*3/uL (ref 0.00–0.07)
Basophils Absolute: 0 10*3/uL (ref 0.0–0.1)
Basophils Relative: 0 %
Eosinophils Absolute: 0 10*3/uL (ref 0.0–0.5)
Eosinophils Relative: 0 %
HCT: 28.6 % — ABNORMAL LOW (ref 36.0–46.0)
Hemoglobin: 9.6 g/dL — ABNORMAL LOW (ref 12.0–15.0)
Immature Granulocytes: 0 %
Lymphocytes Relative: 8 %
Lymphs Abs: 1.5 10*3/uL (ref 0.7–4.0)
MCH: 29.9 pg (ref 26.0–34.0)
MCHC: 33.6 g/dL (ref 30.0–36.0)
MCV: 89.1 fL (ref 80.0–100.0)
Monocytes Absolute: 1.8 10*3/uL — ABNORMAL HIGH (ref 0.1–1.0)
Monocytes Relative: 9 %
Neutro Abs: 15.7 10*3/uL — ABNORMAL HIGH (ref 1.7–7.7)
Neutrophils Relative %: 83 %
Platelets: 202 10*3/uL (ref 150–400)
RBC: 3.21 MIL/uL — ABNORMAL LOW (ref 3.87–5.11)
RDW: 16.2 % — ABNORMAL HIGH (ref 11.5–15.5)
WBC: 19 10*3/uL — ABNORMAL HIGH (ref 4.0–10.5)
nRBC: 0 % (ref 0.0–0.2)

## 2019-07-04 LAB — URINALYSIS, ROUTINE W REFLEX MICROSCOPIC
Bilirubin Urine: NEGATIVE
Glucose, UA: NEGATIVE mg/dL
Ketones, ur: NEGATIVE mg/dL
Nitrite: NEGATIVE
Protein, ur: 100 mg/dL — AB
Specific Gravity, Urine: 1.012 (ref 1.005–1.030)
pH: 7 (ref 5.0–8.0)

## 2019-07-04 LAB — BASIC METABOLIC PANEL
Anion gap: 15 (ref 5–15)
BUN: 64 mg/dL — ABNORMAL HIGH (ref 8–23)
CO2: 27 mmol/L (ref 22–32)
Calcium: 8.3 mg/dL — ABNORMAL LOW (ref 8.9–10.3)
Chloride: 97 mmol/L — ABNORMAL LOW (ref 98–111)
Creatinine, Ser: 3.52 mg/dL — ABNORMAL HIGH (ref 0.44–1.00)
GFR calc Af Amer: 12 mL/min — ABNORMAL LOW (ref >60)
GFR calc non Af Amer: 11 mL/min — ABNORMAL LOW (ref >60)
Glucose, Bld: 71 mg/dL (ref 70–99)
Potassium: 3.8 mmol/L (ref 3.5–5.1)
Sodium: 139 mmol/L (ref 135–145)

## 2019-07-04 LAB — MAGNESIUM: Magnesium: 2.5 mg/dL — ABNORMAL HIGH (ref 1.7–2.4)

## 2019-07-04 LAB — VITAMIN B12: Vitamin B-12: 1183 pg/mL — ABNORMAL HIGH (ref 180–914)

## 2019-07-04 LAB — IRON AND TIBC
Iron: 27 ug/dL — ABNORMAL LOW (ref 28–170)
Saturation Ratios: 11 % (ref 10.4–31.8)
TIBC: 248 ug/dL — ABNORMAL LOW (ref 250–450)
UIBC: 221 ug/dL

## 2019-07-04 LAB — FERRITIN: Ferritin: 508 ng/mL — ABNORMAL HIGH (ref 11–307)

## 2019-07-04 LAB — AMMONIA: Ammonia: 26 umol/L (ref 9–35)

## 2019-07-04 LAB — HIV ANTIBODY (ROUTINE TESTING W REFLEX): HIV Screen 4th Generation wRfx: NONREACTIVE

## 2019-07-04 MED ORDER — VALPROATE SODIUM 500 MG/5ML IV SOLN
250.0000 mg | Freq: Three times a day (TID) | INTRAVENOUS | Status: DC
  Administered 2019-07-04 – 2019-07-06 (×6): 250 mg via INTRAVENOUS
  Filled 2019-07-04 (×8): qty 2.5

## 2019-07-04 MED ORDER — SODIUM CHLORIDE 0.9 % IV SOLN: INTRAVENOUS | Status: AC

## 2019-07-04 MED ORDER — LACTATED RINGERS IV BOLUS
250.0000 mL | Freq: Once | INTRAVENOUS | Status: AC
  Administered 2019-07-04: 250 mL via INTRAVENOUS

## 2019-07-04 MED ORDER — ENOXAPARIN SODIUM 30 MG/0.3ML ~~LOC~~ SOLN
30.0000 mg | SUBCUTANEOUS | Status: DC
  Administered 2019-07-04 – 2019-07-09 (×7): 30 mg via SUBCUTANEOUS
  Filled 2019-07-04 (×7): qty 0.3

## 2019-07-04 MED ORDER — ADULT MULTIVITAMIN W/MINERALS CH
1.0000 | ORAL_TABLET | Freq: Every day | ORAL | Status: DC
  Administered 2019-07-06 – 2019-07-09 (×4): 1 via ORAL
  Filled 2019-07-04 (×5): qty 1

## 2019-07-04 MED ORDER — ENSURE ENLIVE PO LIQD
237.0000 mL | Freq: Two times a day (BID) | ORAL | Status: DC
  Administered 2019-07-05: 237 mL via ORAL

## 2019-07-04 NOTE — Progress Notes (Signed)
Family Medicine Teaching Service Daily Progress Note Intern Pager: [phone]  Patient name: Judy Aguilar Medical record number: [id number] Date of birth: [birth year] Age: 86 y.o. Gender: female  Primary Care Provider: Sandi Mariscal, MD Consultants: None Code Status: DNR  Pt Overview and Major Events to Date:  04/14-Admitted  Assessment and Plan: Judy Aguilar is a 86 y.o. female presenting with mental decline and weakness. PMH is significant for HFrEF with EF 20-25%, previous TIA, right-sided MCA infarct 05/01/2019 that is post lobectomy on Plavix, CKD, HTN, HLD, GERD, breast cancer (2004) status postmastectomy, +/- h/o seizures on Depakote, OSA, DVT?Marland Kitchen   Mental Decline, Fatigue and Weakness Vital signs stable overnight and afebrile.  Continues to improve.  She received 250 bolus LR yesterday.  Creatinine and BUN improving 3.19/56 this morning.  On exam patient more alert oriented to person place and year.  Following commands but still weak.  Tolerating p.o. diet.  Speech assessed yesterday and determined patient can have dysphagia 1 diet with thin fluids. -Switch Depakote to p.o. -Vital signs per unit -Neuro signs every 4 -Repeat LR bolus to 250x1 -BMP in a.m. -Calorie count -Strict I's and O's -Encourage incentive spirometer every 2 while awake -PT/OT recommends SNF  -Speech following  AKI Improving.  Baseline 2.1-2.3.  Creatinine 3.19-->3.52-->3.80.  BUN 56--> 64--> 67.  She received 250 bolus LR yesterday as well as gentle hydration with normal saline at 50 mls x8 hours -Repeat LR bolus -Oral hydration -BMP in a.m.  GOC: Spoke with daughter yesterday about GOC.  She would be happy to talk to palliative care. -Palliative care consulted -Follow-up recommendations  Hypokalemia Potassium 3.3 this morning.  Repleted with 40 mEq x 1 -Continue to monitor -BMP in a.m.  Severe HFrEF Chronic.  Stable.  On room air.  Unmeasured urine output overnight.  Weight appears to have gone up 2  kg from admission.  On exam patient appears euvolemic.  No crackles or wheezes heard on lung exam. -Daily weights -Strict I's and O's -Monitor fluid status -We will repeat LR bolused 250x1 and continue oral hydration  Leukocytosis Stable.  Slight increase from yesterday at 19.7.  Continues to remain afebrile with no source of infection.  Urine culture shows NGTD x2 days -Continue to monitor -CBC with differential in a.m.  Normocytic anemia Chronic.  Stable.  Hemoglobin 10.2 today.  Anemia panel shows ferritin elevated at 508, mild decrease in iron 27 and TIBC 248.  Elevated vitamin B12 and folate pending.   -Continue to monitor   HTN Chronic.  Stable.  SBP 96/50.  Home medications include hydralazine, metoprolol, Isordil, amlodipine. -Continue to hold home medications, can slowly resume when BP and creatinine tolerates -Continue to monitor  OSA Chronic.  Stable.  Patient on room air no desaturations overnight. -Continue to monitor  Right Rib Fracture: Right anterior ninth rib fracture found on chest x-ray at admission.  Normal respirations, maintains oxygen saturations on room air.  No complaints of pain. -Continue to monitor  care   FEN/GI:  -Dysphagia 1, thin liquid  Prophylaxis:  -Lovenox   Disposition: PT/OT recommends SNF placement  Subjective:  No acute events overnight.  Patient improving.  Much more alert today.  Answering questions appropriately.  Following commands  Objective: Temp:  [98.2 F (36.8 C)-98.9 F (37.2 C)] 98.9 F (37.2 C) (04/15 1708) Pulse Rate:  [65-81] 81 (04/15 1708) Resp:  [15-25] 18 (04/15 1708) BP: (104-138)/(54-89) 123/54 (04/15 1708) SpO2:  [97 %-100 %] 100 % (04/15 1708) Weight:  [61.4  kg] 61.4 kg (04/15 0328) Physical Exam:  General: Alert and oriented, no apparent distress  Cardiovascular: RRR with no murmurs noted Respiratory: CTA bilaterally  Gastrointestinal: Bowel sounds present. No abdominal pain Extremities: no  lower extremity edema Neuro: Alert and oriented to person, place and year, following commands but still weak.  Laboratory: Recent Labs  Lab 07/03/19 1022 07/04/19 0104  WBC 22.3* 19.0*  HGB 9.8* 9.6*  HCT 29.7* 28.6*  PLT 232 202   Recent Labs  Lab 07/03/19 1022 07/03/19 1740 07/04/19 0104  NA 140  --  139  K 2.9*  --  3.8  CL 94*  --  97*  CO2 28  --  27  BUN 67*  --  64*  CREATININE 3.80*  --  3.52*  CALCIUM 8.6*  --  8.3*  PROT  --  7.9  --   BILITOT  --  0.7  --   ALKPHOS  --  79  --   ALT  --  11  --   AST  --  24  --   GLUCOSE 99  --  71      Imaging/Diagnostic Tests: X-ray chest PA and lateral  Result Date: 07/03/2019 CLINICAL DATA:  Female with altered mental status, unresponsive. Declining over the past week. EXAM: CHEST - 2 VIEW COMPARISON:  Portable chest 04/28/2019 and earlier. FINDINGS: AP and lateral views of the chest. Chronic cardiomegaly. But resolved pulmonary vascular congestion since February. No pneumothorax, pulmonary edema or consolidation. Probable small pleural effusions, present in February. Visualized tracheal air column is within normal limits. Chronic postoperative changes to the right chest wall. There is a mildly displaced fracture of the right anterior 9th rib. No other No acute osseous abnormality identified. Abdominal Calcified aortic atherosclerosis. Negative visible bowel gas pattern. IMPRESSION: 1. Cardiomegaly with small chronic pleural effusions. But resolved interstitial edema since February. 2. Mildly displaced right anterior 9th rib fracture. 3.  Aortic Atherosclerosis (ICD10-I70.0). Electronically Signed   By: Genevie Ann M.D.   On: 07/03/2019 20:30   CT HEAD WO CONTRAST  Result Date: 07/03/2019 CLINICAL DATA:  Focal neurologic deficit, stroke suspected EXAM: CT HEAD WITHOUT CONTRAST TECHNIQUE: Contiguous axial images were obtained from the base of the skull through the vertex without intravenous contrast. COMPARISON:  MRI 06/18/2019  FINDINGS: Brain: Numerous remote appearing lacunar infarcts in the bilateral basal ganglia are similar to comparison exam. No evidence of acute infarction, hemorrhage, hydrocephalus, extra-axial collection or mass lesion/mass effect. Marked prominence of the ventricles, cisterns and sulci compatible with advanced parenchymal volume loss. Confluent areas of white matter hypoattenuation are most compatible with chronic microvascular angiopathy. Vascular: Atherosclerotic calcification of the carotid siphons and intradural vertebral arteries. No hyperdense vessel. Skull: Benign hyperostosis frontalis interna is mild. No calvarial fracture or suspicious osseous lesion. No scalp swelling or hematoma. Sinuses/Orbits: Minimal mural thickening in the right frontal sinuses. Remaining paranasal sinuses are predominantly clear. Mastoid air cells are well aerated. Orbital structures are unremarkable aside from prior lens extractions. Other: Patient is edentulous. IMPRESSION: 1. No acute intracranial abnormality. If there is persisting clinical concern for acute infarction, MRI is more sensitive and specific for early features of ischemia. 2. Numerous remote appearing lacunar infarcts in the bilateral basal ganglia are similar to comparison exam. 3. Advanced parenchymal volume loss and chronic microvascular angiopathy. Electronically Signed   By: Lovena Le M.D.   On: 07/03/2019 18:41    Carollee Leitz, MD 07/04/2019, 5:49 PM PGY-1, [place] Intern  pager: [phone], text pages welcome

## 2019-07-04 NOTE — Evaluation (Signed)
Occupational Therapy Evaluation Patient Details Name: Judy Aguilar MRN: [id number] DOB: 1926/07/24 Today's Date: 07/04/2019    History of Present Illness 86 y.o. female presenting with mental decline and weakness. 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 intake over the last 2 weeks. Admitted 07/03/19 for treatment of AKI, hypokalemia, leukocytosis, anemia and severe HFrEF. Incidental finding of mildly displaced R anterior 9th rib fx    Clinical Impression   Pt admitted with above diagnoses from [facility]. At time of eval, pt with fluctuating levels to agree to therapy. Pt needing short commands, repetition, and encouragement. Pt completed bed mobility at min A level and sit <> stands at mod A level. Pt was incontinent of bladder and bowels, was unaware. Pt needed total A for peri care in standing with mod A for steadying support. Pt able to tolerate sitting up in chair well, enjoys sitting in the sun. Recommend pt return to SNF for continued BADL and mobility progression. Will continue to follow per POC listed below.    Follow Up Recommendations  SNF;Supervision/Assistance - 24 hour;Other (comment)(return to SNF)    Equipment Recommendations  None recommended by OT    Recommendations for Other Services       Precautions / Restrictions Precautions Precautions: Fall Restrictions Weight Bearing Restrictions: No      Mobility Bed Mobility Overal bed mobility: Needs Assistance Bed Mobility: Supine to Sit     Supine to sit: Min assist;HOB elevated     General bed mobility comments: minA for initiating LE movement to EoB and for scooting hips  forward on bed   Transfers Overall transfer level: Needs assistance Equipment used: 1 person hand held assist Transfers: Sit to/from [facility] Sit to Stand: Mod assist Stand pivot transfers: Mod assist       General transfer comment: modA for coming to standing, decreased knee extension, able to stand with assist for pericare vc for hand placement on therapist to steady. Pt able to stand pivot from bed to recliner, recliner to [facility] and back to recliner.     Balance Overall balance assessment: Needs assistance Sitting-balance support: Feet supported;No upper extremity supported;Bilateral upper extremity supported;Single extremity supported Sitting balance-Leahy Scale: Poor     Standing balance support: Bilateral upper extremity supported;During functional activity Standing balance-Leahy Scale: Zero                             ADL either performed or assessed with clinical judgement   ADL Overall ADL's : Needs assistance/impaired Eating/Feeding: Minimal assistance;Sitting;Cueing for safety   Grooming: Set up;Sitting Grooming Details (indicate cue type and reason): washed face when set up with wash cloth Upper Body Bathing: Moderate assistance;Sitting   Lower Body Bathing: Maximal assistance;Sit to/from stand;Sitting/lateral leans   Upper Body Dressing : Moderate assistance;Sitting   Lower Body Dressing: Maximal assistance;Sitting/lateral leans;Sit to/from stand   Toilet Transfer: Moderate assistance;+2 for safety/equipment;Stand-pivot;BSC Toilet Transfer Details (indicate cue type and reason): stand pivot from bed > recliner > BSC > recliner Toileting- Clothing Manipulation and Hygiene: Total assistance;Sit to/from stand Toileting - Clothing Manipulation Details (indicate cue type and reason): pt incontinent of urine and bowels this  session     Functional mobility during ADLs: Moderate assistance;+2 for physical assistance;+2 for  safety/equipment(stand pivots only)       Vision Baseline Vision/History: Wears glasses Wears Glasses: At all times Patient Visual Report: No change from baseline       Perception     Praxis      Pertinent Vitals/Pain Pain Assessment: No/denies pain     Hand Dominance     Extremity/Trunk Assessment Upper Extremity Assessment Upper Extremity Assessment: Generalized weakness;Difficult to assess due to impaired cognition   Lower Extremity Assessment Lower Extremity Assessment: Defer to PT evaluation       Communication Communication Communication: HOH;Expressive difficulties   Cognition Arousal/Alertness: Lethargic Behavior During Therapy: Flat affect Overall Cognitive Status: History of cognitive impairments - at baseline                                 General Comments: history of dementia at baseline. Pt originally resistant to therapy, but with explanation and education was willing to participate. Pt needing increased time, simple cues, and repitition for increased independence   General Comments       Exercises     Shoulder Instructions      Home Living Family/patient expects to be discharged to:: Skilled nursing facility                                 Additional Comments: [facility]      Prior Functioning/Environment Level of Independence: Needs assistance  Gait / Transfers Assistance Needed: per notes primarily uses w/c ADL's / Homemaking Assistance Needed: assist from BADLs/IADLs per facility Communication / Swallowing Assistance Needed: mumbles           OT Problem List: Decreased strength;Decreased knowledge of use of DME or AE;Decreased activity tolerance;Decreased cognition;Cardiopulmonary status limiting activity;Impaired balance (sitting and/or standing);Decreased safety awareness      OT Treatment/Interventions: Self-care/ADL training;Therapeutic exercise;Patient/family education;Balance training;Energy  conservation;Therapeutic activities;DME and/or AE instruction;Cognitive remediation/compensation    OT Goals(Current goals can be found in the care plan section) Acute Rehab OT Goals Patient Stated Goal: sit in the sun OT Goal Formulation: With patient Time For Goal Achievement: 07/18/19 Potential to Achieve Goals: Good  OT Frequency: Min 2X/week   Barriers to D/C:            Co-evaluation PT/OT/SLP Co-Evaluation/Treatment: Yes Reason for Co-Treatment: For patient/therapist safety;To address functional/ADL transfers;Necessary to address cognition/behavior during functional activity   OT goals addressed during session: ADL's and self-care;Proper use of Adaptive equipment and DME;Strengthening/ROM      AM-PAC OT "6 Clicks" Daily Activity     Outcome Measure Help from another person eating meals?: A Little Help from another person taking care of personal grooming?: A Little Help from another person toileting, which includes using toliet, bedpan, or urinal?: A Lot Help from another person bathing (including washing, rinsing, drying)?: Total Help from another person to put on and taking off regular upper body clothing?: A Lot Help from another person to put on and taking off regular lower body clothing?: Total 6 Click Score: 12   End of Session Nurse Communication: Mobility status  Activity Tolerance: Patient tolerated treatment well Patient left: in chair;with call bell/phone within reach;with chair alarm set  OT Visit Diagnosis: Unsteadiness on feet (R26.81);Other abnormalities of gait and mobility (R26.89);Muscle weakness (generalized) (M62.81);Other symptoms and signs involving cognitive  function                Time: 1127-1213 OT Time Calculation (min): 46 min Charges:  OT General Charges $OT Visit: 1 Visit OT Evaluation $OT Eval Moderate Complexity: [place], MSOT, OTR/L Acute Rehabilitation Services [facility] Office Number: [phone] Pager:  [phone]  Zenovia Jarred 07/04/2019, 5:49 PM

## 2019-07-04 NOTE — Evaluation (Signed)
Clinical/Bedside Swallow Evaluation Patient Details  Name: Judy Aguilar MRN: [id number] Date of Birth: 1927/01/07  Today's Date: 07/04/2019 Time: SLP Start Time (ACUTE ONLY): 1248 SLP Stop Time (ACUTE ONLY): 1304 SLP Time Calculation (min) (ACUTE ONLY): 16 min  Past Medical History:  Past Medical History:  Diagnosis Date  . Chronic pain   . Colon polyp   . Current use of long term anticoagulation   . Diverticulitis of colon   . DVT (deep venous thrombosis) ([place])   . Esophagitis   . Gastritis   . Generalized osteoarthrosis, involving multiple sites   . Helicobacter pylori ab+   . High risk medication use   . Hypercholesterolemia   . Hyperlipidemia   . Hypertension    benign  . Iron deficiency anemia   . LBBB (left bundle branch block)   . Mixed hyperlipidemia   . Neoplasm of breast   . OSA (obstructive sleep apnea)   . Osteoarthritis of right knee   . Osteopenia   . Peripheral arterial disease ([place])   . Vitamin D deficiency    Past Surgical History: No past surgical history on file. HPI:  Pt is a 86 y/o female with PMH of Vit D deficiency, PAD, OSA, HLD, HTN, Anemia, recent admission for CVA and possible seizure, who was brought in for progressive weakness. CT of the head was negative for acute changes. CXR negative for consolidation.    Assessment / Plan / Recommendation Clinical Impression  Pt was seen for bedside swallow evaluation and she denied a history of dysphagia. Oral mechanism exam was significant for left-sided facial weakness and reduced lingual strength and ROM. Dentition was adequate with full dentures. She tolerated all solids and liquids without signs or symptoms of aspiration. However, pt exhibited moderately prolonged mastication with regular texture solids and mild oral residue was noted. She required verbal and tactile cues to demonstrate adequate opening for acceptance of trials and labial stripping. Bolus sizes with puree were therefore small. A  dysphagia 1 (puree) diet with thin liquids is recommended at this time. SLP will follow for diet tolerance and to assess her ability to tolerate more advanced consistencies.  SLP Visit Diagnosis: Dysphagia, oral phase (R13.11)    Aspiration Risk  No limitations    Diet Recommendation Dysphagia 1 (Puree);Thin liquid   Liquid Administration via: Cup;Straw Medication Administration: Crushed with puree Supervision: Staff to assist with self feeding;Full supervision/cueing for compensatory strategies Compensations: Slow rate;Small sips/bites;Minimize environmental distractions;Follow solids with liquid Postural Changes: Seated upright at 90 degrees;Remain upright for at least 30 minutes after po intake    Other  Recommendations Oral Care Recommendations: Oral care BID;Staff/trained caregiver to provide oral care   Follow up Recommendations Other (comment)(TBD)      Frequency and Duration min 2x/week  2 weeks       Prognosis Prognosis for Safe Diet Advancement: Fair Barriers to Reach Goals: Cognitive deficits;Severity of deficits      Swallow Study   General Date of Onset: 07/03/19 HPI: Pt is a 86 y/o female with PMH of Vit D deficiency, PAD, OSA, HLD, HTN, Anemia, recent admission for CVA and possible seizure, who was brought in for progressive weakness. CT of the head was negative for acute changes. CXR negative for consolidation.  Type of Study: Bedside Swallow Evaluation Previous Swallow Assessment: None Diet Prior to this Study: Regular;Thin liquids Temperature Spikes Noted: No Respiratory Status: Room air History of Recent Intubation: No Behavior/Cognition: Alert;Cooperative;Pleasant mood Oral Cavity Assessment: Within Functional Limits Oral Care  Completed by SLP: No Oral Cavity - Dentition: Dentures, top;Dentures, bottom Vision: Functional for self-feeding Self-Feeding Abilities: Able to feed self Patient Positioning: Upright in chair;Postural control adequate for  testing Baseline Vocal Quality: Hoarse Volitional Cough: Weak Volitional Swallow: Able to elicit    Oral/Motor/Sensory Function Overall Oral Motor/Sensory Function: Mild impairment Facial ROM: Reduced left;Suspected CN VII (facial) dysfunction Facial Symmetry: Abnormal symmetry left;Suspected CN VII (facial) dysfunction Facial Strength: Reduced left;Suspected CN VII (facial) dysfunction Facial Sensation: Within Functional Limits Lingual ROM: Reduced left;Reduced right Lingual Symmetry: Within Functional Limits Lingual Strength: Reduced;Suspected CN XII (hypoglossal) dysfunction Lingual Sensation: Within Functional Limits Velum: Other (comment)(Pt unable to demonstrate )   Ice Chips Ice chips: Not tested Presentation: (Pt did not accept presented boluses.)   Thin Liquid Thin Liquid: Within functional limits Presentation: Cup;Straw    Nectar Thick Nectar Thick Liquid: Not tested   Honey Thick Honey Thick Liquid: Not tested   Puree Puree: Impaired Presentation: Spoon Oral Phase Impairments: Reduced lingual movement/coordination   Solid     Solid: Impaired Presentation: Self Fed Oral Phase Impairments: Impaired mastication Oral Phase Functional Implications: Impaired mastication;Oral residue(MIld)     Judy Aguilar, [place], [place] Office number [phone] Pager [phone]  Horton Marshall 07/04/2019,1:11 PM

## 2019-07-04 NOTE — Progress Notes (Signed)
Family Medicine Teaching Service Daily Progress Note Intern Pager: [phone]  Patient name: Judy Aguilar Medical record number: [id number] Date of birth: 1926/05/06 Age: 86 y.o. Gender: female  Primary Care Provider: Sandi Mariscal, MD Consultants: None Code Status: DNR  Pt Overview and Major Events to Date:  04/14-Admitted  Assessment and Plan: Judy Aguilar is a 86 y.o. female presenting with mental decline and weakness. PMH is significant for HFrEF with EF 20-25%, previous TIA, right-sided MCA infarct 05/01/2019 that is post lobectomy on Plavix, CKD, HTN, HLD, GERD, breast cancer (2004) status postmastectomy, +/- h/o seizures on Depakote, OSA, DVT?Marland Kitchen   Mental Decline, Fatigue and Weakness Vital signs stable overnight.  Remains afebrile.  On exam patient alert and oriented to self.  Does not follow commands.  Continues to answer "no "all questions asked.  Difficult to perform neuro exam given history of dementia.  Patient passed swallow study yesterday but per RN is refusing p.o. intake.   -Switch Depakote to 200 mg IV every 6 as patient cannot tolerate p.o. medications. -Vital signs per unit -Neuro signs every 4 -PT/OT/speech eval -N.p.o. until formal speech eval -Calorie count when able to tolerate p.o. intake -Incentive spirometer every 2 hours while awake -Follow-up EKG -BMP in a.m. -CBC in a.m.  AKI Improving.  Creatinine down to 3.52 from 3.80 yesterday.  (Baseline approximately 2.1-2.3) likely secondary to dehydration and diuretic use. -Continue gentle IV hydration normal saline at 18mLs x 8 with consideration low ejection fraction -Oral hydration when able to tolerate p.o. -BMP in a.m.  Hypokalemia Resolved.  Potassium 3.8 after replacement with 60 mEq KCl yesterday.  Likely due to diuretic use and poor p.o. intake -BMP in a.m.  Severe HFrEF Signs stable.  Mains on room air with oxygen saturations 99%.  Urine output 650 mL since admission.  Echo from 04/29/2019 reveals left  ventricular ejection fraction 20-25% with mild LVH and severe global hypokinesis of LV and severely increased left atrial volume with mild right ventricular dilation.  Patient was taking Lasix 40 mg twice daily, Isordil 30 mg 3 times daily, Lopressor 25 mg twice daily and Crestor 40 mg daily.  Patient appears euvolemic on exam -Daily weights -Strict I's and O's -Monitor fluid status -Continue to hold medication for soft BPs, consider resuming when BP tolerates -Caution with IV hydration given low ejection fraction.  Leukocytosis Improving.  WBC is 19 today down from 22.3.  Continues to remain have febrile with no source of infection.  Likely due to dehydration.   -Continue to monitor -Urine culture pending -CBC in a.m.  Normocytic anemia Stable. Hemoglobin 9.6 down from 9.8 appears that baseline 9.6.  MCV 89.1.  No significant signs of bleeding.  Hemodynamically stable. -Continue to monitor -Continue anemia panel  HTN Stable.  SBP 104-138 overnight.  Home medications include hydralazine 10 mg every 8, morphine 5 mg daily, Isordil 30 mg daily, metoprolol 50 mg daily. -Continue to hold home medications and will resume when BP and creatinine allows -Continue to monitor  OSA Unknown if uses CPAP at night. -Continue to monitor resp status  PAD Right MCA CVA on 04/28/19 with left hemibody weakness and left facial droop felt to be 2/2 cryptogenic etiology started on plavix 75mg  QD and Crestor 40mg  QD. Had thrombectomy with reperfusion of area and improvement in symptoms. Discharged to SNF for further PT/OT. SLP at that time recommended ground diet with full range of liquids. Zio patch provided at discharge to evaluate for underlying arrhythmia as her stroke etiology is  unknown. Was supposed to follow up with cardiology outpatient but this was postponed until further notice.  - consider cards consult to evaluate Zio patch  Right Rib Fracture: Incidental findings. CXR notable for a mildly  displaced right anterior 9th rib fracture. Denies any pain. - consider having ortho weigh in   [place]: Spoke with daughter about [place].  She reports that at this time she would just want to keep her mom comfortable.  She is aware of consult to Palliative care -f/u Palliative care   FEN/GI:  -NPO  Prophylaxis:  -Lovenox   Disposition: Home when medically stable and pending PT/OT recs  Subjective:  No acute events overnight.  She continues to be oriented to self only.  Currently follows commands.  Denies any chest pain, shortness of breath or abdominal pain.  Objective: Temp:  [98.5 F (36.9 C)-98.6 F (37 C)] 98.5 F (36.9 C) (04/15 0328) Pulse Rate:  [53-77] 65 (04/15 0328) Resp:  [13-25] 18 (04/15 0328) BP: (102-138)/(42-97) 120/61 (04/15 0328) SpO2:  [95 %-100 %] 100 % (04/15 0328) Physical Exam:  General: 86 year old female in no apparent distress  Cardiovascular: Regular rate and rhythm, no murmurs or gallops appreciated Respiratory: Chest clear to auscultation bilaterally, no crackles or wheezing noted Abdomen: Soft, nontender, nondistended, bowel sounds present Extremities: Sporadically moves extremities, trace lower extremity edema. Neuro: Alert and oriented to person only.  Unable to complete full neuro exam as patient has mild dementia.    Laboratory: Recent Labs  Lab 07/03/19 1022 07/04/19 0104  WBC 22.3* 19.0*  HGB 9.8* 9.6*  HCT 29.7* 28.6*  PLT 232 202   Recent Labs  Lab 07/03/19 1022 07/03/19 1740 07/04/19 0104  NA 140  --  139  K 2.9*  --  3.8  CL 94*  --  97*  CO2 28  --  27  BUN 67*  --  64*  CREATININE 3.80*  --  3.52*  CALCIUM 8.6*  --  8.3*  PROT  --  7.9  --   BILITOT  --  0.7  --   ALKPHOS  --  79  --   ALT  --  11  --   AST  --  24  --   GLUCOSE 99  --  71      Imaging/Diagnostic Tests: X-ray chest PA and lateral  Result Date: 07/03/2019 CLINICAL DATA:  Female with altered mental status, unresponsive. Declining over the  past week. EXAM: CHEST - 2 VIEW COMPARISON:  Portable chest 04/28/2019 and earlier. FINDINGS: AP and lateral views of the chest. Chronic cardiomegaly. But resolved pulmonary vascular congestion since February. No pneumothorax, pulmonary edema or consolidation. Probable small pleural effusions, present in February. Visualized tracheal air column is within normal limits. Chronic postoperative changes to the right chest wall. There is a mildly displaced fracture of the right anterior 9th rib. No other No acute osseous abnormality identified. Abdominal Calcified aortic atherosclerosis. Negative visible bowel gas pattern. IMPRESSION: 1. Cardiomegaly with small chronic pleural effusions. But resolved interstitial edema since February. 2. Mildly displaced right anterior 9th rib fracture. 3.  Aortic Atherosclerosis (ICD10-I70.0). Electronically Signed   By: Genevie Ann M.D.   On: 07/03/2019 20:30   CT HEAD WO CONTRAST  Result Date: 07/03/2019 CLINICAL DATA:  Focal neurologic deficit, stroke suspected EXAM: CT HEAD WITHOUT CONTRAST TECHNIQUE: Contiguous axial images were obtained from the base of the skull through the vertex without intravenous contrast. COMPARISON:  MRI 06/18/2019 FINDINGS: Brain: Numerous remote appearing lacunar infarcts  in the bilateral basal ganglia are similar to comparison exam. No evidence of acute infarction, hemorrhage, hydrocephalus, extra-axial collection or mass lesion/mass effect. Marked prominence of the ventricles, cisterns and sulci compatible with advanced parenchymal volume loss. Confluent areas of white matter hypoattenuation are most compatible with chronic microvascular angiopathy. Vascular: Atherosclerotic calcification of the carotid siphons and intradural vertebral arteries. No hyperdense vessel. Skull: Benign hyperostosis frontalis interna is mild. No calvarial fracture or suspicious osseous lesion. No scalp swelling or hematoma. Sinuses/Orbits: Minimal mural thickening in the right  frontal sinuses. Remaining paranasal sinuses are predominantly clear. Mastoid air cells are well aerated. Orbital structures are unremarkable aside from prior lens extractions. Other: Patient is edentulous. IMPRESSION: 1. No acute intracranial abnormality. If there is persisting clinical concern for acute infarction, MRI is more sensitive and specific for early features of ischemia. 2. Numerous remote appearing lacunar infarcts in the bilateral basal ganglia are similar to comparison exam. 3. Advanced parenchymal volume loss and chronic microvascular angiopathy. Electronically Signed   By: Lovena Le M.D.   On: 07/03/2019 18:41    Carollee Leitz, MD 07/04/2019, 5:44 AM PGY-1, [place] Intern pager: [phone], text pages welcome

## 2019-07-04 NOTE — Progress Notes (Signed)
SLP Cancellation Note  Patient Details Name: Judy Aguilar MRN: [id number] DOB: Jun 07, 1926   Cancelled treatment:       Reason Eval/Treat Not Completed: Patient at procedure or test/unavailable(Pt on bedside commode with PT/OT. SLP will follow up)  Katniss Weedman I. Hardin Negus, [place], [place] Office number [phone] Pager [place] 07/04/2019, 12:03 PM

## 2019-07-04 NOTE — Progress Notes (Signed)
Initial Nutrition Assessment  DOCUMENTATION CODES:   Not applicable  INTERVENTION:   -Ensure Enlive po BID, each supplement provides 350 kcal and 20 grams of protein -Magic cup TID with meals, each supplement provides 290 kcal and 9 grams of protein -MVI with minerals daily -Feeding assistance with meals  NUTRITION DIAGNOSIS:   Inadequate oral intake related to lethargy/confusion as evidenced by NPO status.  GOAL:   Patient will meet greater than or equal to 90% of their needs  MONITOR:   PO intake, Supplement acceptance, Diet advancement, Labs, Weight trends, Skin  REASON FOR ASSESSMENT:   Low Braden    ASSESSMENT:   86 Y/O F with PMX of Vit D deficiency, PAD, OSA, HLD, HTN, Anemia, recent admission for CVA and possible seizure, brought in for progressive weakness. She has no chest pain, no SOB, no recent fall.  Pt admitted with generalized weakness.   4/15- s/p BSE- advanced to dysphagia 1 diet with thin liquids  Reviewed I/O's: +650 ml x 24 hours  Pt very lethargic at time of visit. Pt opened eyes when name was called, but quickly fell back asleep. RD unable to arouse pt enough for interview.   Pt NPO at time of visit, however, SLP upgraded pt to dysphagia 1 diet with thin liquids. Suspect inadequate oral intake given level of mentation.   Palliative care has been consulted for goals of care discussions.   Labs reviewed.   NUTRITION - FOCUSED PHYSICAL EXAM:    Most Recent Value  Orbital Region  Mild depletion  Upper Arm Region  No depletion  Thoracic and Lumbar Region  No depletion  Buccal Region  No depletion  Temple Region  Mild depletion  Clavicle Bone Region  No depletion  Clavicle and Acromion Bone Region  No depletion  Scapular Bone Region  No depletion  Dorsal Hand  Mild depletion  Patellar Region  Mild depletion  Anterior Thigh Region  Mild depletion  Posterior Calf Region  Mild depletion  Edema (RD Assessment)  Mild  Hair  Reviewed  Eyes   Reviewed  Mouth  Reviewed  Skin  Reviewed  Nails  Reviewed       Diet Order:   Diet Order            DIET - DYS 1 Room service appropriate? No; Fluid consistency: Thin  Diet effective now              EDUCATION NEEDS:   No education needs have been identified at this time  Skin:  Skin Assessment: Reviewed RN Assessment  Last BM:  Unknown  Height:   Ht Readings from Last 1 Encounters:  07/04/19 5' (1.524 m)    Weight:   Wt Readings from Last 1 Encounters:  07/04/19 61.4 kg    Ideal Body Weight:  45.5 kg  BMI:  Body mass index is 26.44 kg/m.  Estimated Nutritional Needs:   Kcal:  1550-1750  Protein:  70-85 grams  Fluid:  > 1.5 L    Loistine Chance, RD, LDN, [place] Registered Dietitian II Certified Diabetes Care and Education Specialist Please refer to [facility] for RD and/or RD on-call/weekend/after hours pager

## 2019-07-04 NOTE — Evaluation (Signed)
Physical Therapy Evaluation Patient Details Name: Judy Aguilar MRN: [id number] DOB: 1926/04/04 Today's Date: 07/04/2019   History of Present Illness  86 y.o. female presenting with mental decline and weakness. 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 intake over the last 2 weeks. Admitted 07/03/19 for treatment of AKI, hypokalemia, leukocytosis, anemia and severe HFrEF. Incidental finding of mildly displaced R anterior 9th rib fx   Clinical Impression  PTA pt resident of [place], per notes pt is wheelchair bound and dependent on staff for ADLs/iADLs. Pt initially reluctant to work with therapy, with very little verbalization. When attempted to assist pt to sit EoB she resisted. PT educated pt to fact that her bed was saturated in urine and her linens needed to be changed pt agreed requiring min A for initiation of LE movement, trunk to upright and pad scoot of hips to EoB. PT/OT provided mod 2 person assist to pivot from bed to recliner. Pt became incontinent of stool on way to chair. Pt provided modA to come to standing for pericare, and due to continued stooling pt pivoted to [facility]. Pt stood with modAx1 for pericare and transfer back to [facility]. PT recommends return to [facility] at discharge. PT will work with pt acutely to progress mobility.     Follow Up Recommendations SNF    Equipment Recommendations  None recommended by PT       Precautions / Restrictions Precautions Precautions: Fall Restrictions Weight Bearing Restrictions: No      Mobility  Bed Mobility Overal bed mobility: Needs Assistance Bed Mobility: Supine to Sit     Supine to sit: Min  assist;HOB elevated     General bed mobility comments: minA for initiating LE movement to EoB and for scooting hips forward on bed   Transfers Overall transfer level: Needs assistance Equipment used: 1 person hand held assist Transfers: Sit to/from [facility] Sit to Stand: Mod assist Stand pivot transfers: Mod assist       General transfer comment: modA for coming to standing, decreased knee extension, able to stand with assist for pericare vc for hand placement on therapist to steady. Pt able to stand pivot from bed to recliner, recliner to [facility] and back to recliner.       Balance Overall balance assessment: Needs assistance Sitting-balance support: Feet supported;No upper extremity supported;Bilateral upper extremity supported;Single extremity supported Sitting balance-Leahy Scale: Poor     Standing balance support: Bilateral upper extremity supported;During functional activity Standing balance-Leahy Scale: Zero                               Pertinent Vitals/Pain Pain Assessment: No/denies pain    Home Living Family/patient expects to be discharged to:: Skilled nursing facility                      Prior Function Level of Independence: Needs assistance   Gait / Transfers Assistance Needed: per notes wheelchair bound      Comments: provides limited information about level of care     Hand Dominance        Extremity/Trunk Assessment   Upper Extremity Assessment  Upper Extremity Assessment: Defer to OT evaluation    Lower Extremity Assessment Lower Extremity Assessment: Difficult to assess due to impaired cognition;LLE deficits/detail LLE Deficits / Details: from mobility L LE weaker than R LE    Cervical / Trunk Assessment Cervical / Trunk Assessment: Kyphotic  Communication   Communication: HOH;Expressive difficulties  Cognition Arousal/Alertness: Lethargic Behavior During Therapy: Flat affect Overall Cognitive  Status: History of cognitive impairments - at baseline                                        General Comments General comments (skin integrity, edema, etc.): VSS on RA,      Assessment/Plan    PT Assessment Patient needs continued PT services  PT Problem List Decreased strength;Decreased range of motion;Decreased activity tolerance;Decreased balance;Decreased mobility;Decreased cognition;Decreased safety awareness       PT Treatment Interventions DME instruction;Gait training;Functional mobility training;Therapeutic activities;Therapeutic exercise;Balance training;Cognitive remediation;Patient/family education    PT Goals (Current goals can be found in the Care Plan section)  Acute Rehab PT Goals Patient Stated Goal: sit in the sun PT Goal Formulation: Patient unable to participate in goal setting Time For Goal Achievement: 07/18/19 Potential to Achieve Goals: Good    Frequency Min 2X/week    AM-PAC PT "6 Clicks" Mobility  Outcome Measure Help needed turning from your back to your side while in a flat bed without using bedrails?: A Little Help needed moving from lying on your back to sitting on the side of a flat bed without using bedrails?: A Lot Help needed moving to and from a bed to a chair (including a wheelchair)?: A Lot Help needed standing up from a chair using your arms (e.g., wheelchair or bedside chair)?: A Lot Help needed to walk in hospital room?: Total Help needed climbing 3-5 steps with a railing? : Total 6 Click Score: 11    End of Session Equipment Utilized During Treatment: Gait belt Activity Tolerance: Patient tolerated treatment well Patient left: in chair;with call bell/phone within reach;with chair alarm set Nurse Communication: Mobility status PT Visit Diagnosis: Unsteadiness on feet (R26.81);Other abnormalities of gait and mobility (R26.89);Muscle weakness (generalized) (M62.81);Difficulty in walking, not elsewhere classified  (R26.2)    Time: 5093-2671 PT Time Calculation (min) (ACUTE ONLY): 43 min   Charges:   PT Evaluation $PT Eval Moderate Complexity: 1 Mod PT Treatments $Therapeutic Activity: 8-22 mins        Talvin Christianson B. Migdalia Dk PT, DPT Acute Rehabilitation Services Pager [phone] Office [phone]   [place] 07/04/2019, 1:55 PM

## 2019-07-04 NOTE — Plan of Care (Signed)
  Problem: Education: Goal: Knowledge of General Education information will improve Description: Including pain rating scale, medication(s)/side effects and non-pharmacologic comfort measures Outcome: Progressing   Problem: Health Behavior/Discharge Planning: Goal: Ability to manage health-related needs will improve Outcome: Progressing   Problem: Clinical Measurements: Goal: Ability to maintain clinical measurements within normal limits will improve Outcome: Progressing   Problem: Clinical Measurements: Goal: Will remain free from infection Outcome: Progressing   Problem: Nutrition: Goal: Adequate nutrition will be maintained Outcome: Progressing   Problem: Safety: Goal: Ability to remain free from injury will improve Outcome: Progressing

## 2019-07-05 DIAGNOSIS — R569 Unspecified convulsions: Secondary | ICD-10-CM | POA: Diagnosis not present

## 2019-07-05 DIAGNOSIS — F0390 Unspecified dementia without behavioral disturbance: Secondary | ICD-10-CM

## 2019-07-05 DIAGNOSIS — Z66 Do not resuscitate: Secondary | ICD-10-CM | POA: Diagnosis not present

## 2019-07-05 DIAGNOSIS — E860 Dehydration: Secondary | ICD-10-CM | POA: Diagnosis not present

## 2019-07-05 DIAGNOSIS — Z7189 Other specified counseling: Secondary | ICD-10-CM

## 2019-07-05 DIAGNOSIS — Z515 Encounter for palliative care: Secondary | ICD-10-CM | POA: Diagnosis not present

## 2019-07-05 DIAGNOSIS — R531 Weakness: Secondary | ICD-10-CM | POA: Diagnosis not present

## 2019-07-05 DIAGNOSIS — N179 Acute kidney failure, unspecified: Secondary | ICD-10-CM | POA: Diagnosis not present

## 2019-07-05 LAB — BASIC METABOLIC PANEL
Anion gap: 17 — ABNORMAL HIGH (ref 5–15)
BUN: 56 mg/dL — ABNORMAL HIGH (ref 8–23)
CO2: 26 mmol/L (ref 22–32)
Calcium: 8.5 mg/dL — ABNORMAL LOW (ref 8.9–10.3)
Chloride: 100 mmol/L (ref 98–111)
Creatinine, Ser: 3.19 mg/dL — ABNORMAL HIGH (ref 0.44–1.00)
GFR calc Af Amer: 14 mL/min — ABNORMAL LOW (ref >60)
GFR calc non Af Amer: 12 mL/min — ABNORMAL LOW (ref >60)
Glucose, Bld: 61 mg/dL — ABNORMAL LOW (ref 70–99)
Potassium: 3.3 mmol/L — ABNORMAL LOW (ref 3.5–5.1)
Sodium: 143 mmol/L (ref 135–145)

## 2019-07-05 LAB — CBC
HCT: 31.2 % — ABNORMAL LOW (ref 36.0–46.0)
Hemoglobin: 10.2 g/dL — ABNORMAL LOW (ref 12.0–15.0)
MCH: 29.2 pg (ref 26.0–34.0)
MCHC: 32.7 g/dL (ref 30.0–36.0)
MCV: 89.4 fL (ref 80.0–100.0)
Platelets: 220 10*3/uL (ref 150–400)
RBC: 3.49 MIL/uL — ABNORMAL LOW (ref 3.87–5.11)
RDW: 16 % — ABNORMAL HIGH (ref 11.5–15.5)
WBC: 19.7 10*3/uL — ABNORMAL HIGH (ref 4.0–10.5)
nRBC: 0 % (ref 0.0–0.2)

## 2019-07-05 LAB — FOLATE RBC
Folate, Hemolysate: 249 ng/mL
Folate, RBC: 868 ng/mL
Hematocrit: 28.7 % — ABNORMAL LOW (ref 34.0–46.6)

## 2019-07-05 LAB — RPR: RPR Ser Ql: NONREACTIVE

## 2019-07-05 LAB — MAGNESIUM: Magnesium: 2.5 mg/dL — ABNORMAL HIGH (ref 1.7–2.4)

## 2019-07-05 MED ORDER — POTASSIUM CHLORIDE 10 MEQ/100ML IV SOLN
10.0000 meq | INTRAVENOUS | Status: AC
  Administered 2019-07-05 (×4): 10 meq via INTRAVENOUS
  Filled 2019-07-05 (×4): qty 100

## 2019-07-05 MED ORDER — POTASSIUM CHLORIDE 20 MEQ PO PACK
40.0000 meq | PACK | ORAL | Status: DC
  Filled 2019-07-05: qty 2

## 2019-07-05 MED ORDER — ENSURE ENLIVE PO LIQD
237.0000 mL | Freq: Three times a day (TID) | ORAL | Status: DC
  Administered 2019-07-05 – 2019-07-09 (×8): 237 mL via ORAL

## 2019-07-05 MED ORDER — LACTATED RINGERS IV BOLUS
250.0000 mL | Freq: Once | INTRAVENOUS | Status: AC
  Administered 2019-07-05: 250 mL via INTRAVENOUS

## 2019-07-05 NOTE — Consult Note (Addendum)
Consultation Note Date: 07/05/2019   Patient Name: Judy Aguilar  DOB: 08-09-1926  MRN: [id number]  Age / Sex: 86 y.o., female  PCP: Sandi Mariscal, MD Referring Physician: Zenia Resides, MD  Reason for Consultation: Establishing goals of care  HPI/Patient Profile:  86 Y/O F with PMX of Vit D deficiency, PAD, OSA, HLD, HTN, Anemia, recent admission for CVA and possible seizure, brought in for progressive weakness.   Palliative care was asked to address goals of care in the setting of HFrEF 20%.  Clinical Assessment and Goals of Care: I have reviewed medical records including EPIC notes, labs and imaging, received report from bedside RN, assessed the patient.    I called Mathews Robinsons (daughter)  to further discuss diagnosis prognosis, GOC, EOL wishes, disposition and options.   I introduced Palliative Medicine as specialized medical care for people living with serious illness. It focuses on providing relief from the symptoms and stress of a serious illness. The goal is to improve quality of life for both the patient and the family.  I asked Enid Derry to describe her mother for me. She shared that she was from [facility]. originally. She had been married though her husband has been deceased since 31. She has a very loving family who are involved in all of her care. 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 She is identified at [employer] of her family. In terms of things that she use to enjoy she was an Geologist, engineering, she cooked all food types but specialized in [employer]. She was an incredibly active member of her church and is of the [employer]. Patient watches judge shows and the price is right.   In terms of patients health. She was independent until October of this last year when she started declining. She  prior was living in a home with her son. She was fully independent of all bADLs. She was using a cane to mobilize in her home.   A detailed discussion was had today regarding advanced directives, there on none formally filed though Enid Derry attests to being her decision maker through collaboration with her other family members.  Concepts specific to code status, artifical feeding and hydration, continued IV antibiotics and rehospitalization was had.  Enid Derry shared that she knows her mother very well and she feels that the patient has "given up." She said that her not eating and drinking may be a reflection of her reaching the end of her time. Enid Derry shares that she has told the rest of their family that time is short and that they should all prepare themselves for the worst.  We discussed delirium in the setting of infection, seizures, change of environment, disarray of circadian rhythm. Enid Derry seemed to understand this. She said that she is interested in continuing the current mode of care. Enid Derry was clear that her mother would not want heroic interventions such as intubation, CPR, or artificial  nutrition via feeding tube. Cooking and eating was such a big part of Nicki's life that artificially feeding her would not be consistent with her wishes per her daughter.   The difference between a aggressive medical intervention path  and a palliative comfort care path for this patient at this time was had. Values and goals of care important to patient and family were attempted to be elicited. Kysha loves joking and spending time cooking for her family. She is a vibrant woman who at one point was full of tremendous life.  Per Alinda Dooms discussion with her daughter Enid Derry she would have never wanted to be a burden on her family. She asked to go to a nursing home if she ever got sick.  I broached the topic of hospice with Enid Derry. She said that she has familiarity with hospice as she knows other people who  have had it. I told Enid Derry it is truly preserved as a benefit for patients with terminal illness. The focus transitions from that of curative to that of symptom based. Enid Derry did express interested in learning more about hospice in the event that Emersynn continues to decline  Discussed the importance of continued conversation with family and their  medical providers regarding overall plan of care and treatment options, ensuring decisions are within the context of the patients values and GOCs.  Decision Maker: Mathews Robinsons [phone]  SUMMARY OF RECOMMENDATIONS   DNAR/DNI  No feeding tubes  Time trial of 48 hours to identify if improvements are made  TOC --> Referral to hospice for informational purposes  Chaplain --> Patient is of the [place] Planning:  DNR  Symptom Management: Muscular Weakness: - Physical Therapy Evaluation - Occupational Therapy Evaluation  Failure to Thrive: Dysphagia:                 - Nutrition   * Ensure Enlive BID   * Magic cup TID   * Multivitamin daily  - Speech involved on dysphagia 1 diet                 - Encourage 1:1 feeding                 - Out of bed for meals                 - Aspiration precautions  Oral Care:                 - QShift oral assessment and management  Delirium: - Delirium precautions - Get up during the day - Encourage a familiar face to remain present throughout the day - Keep blinds open and lights on during daylight hours - Minimize the use of opioids/benzodiazepines  Spiritual: - Chaplain consult  Palliative Prophylaxis:   Aspiration, Bowel Regimen, Delirium Protocol, Eye Care, Frequent Pain Assessment, Oral Care and Turn Reposition  Additional Recommendations (Limitations, Scope, Preferences):  Treat the  treatable  Psycho-social/Spiritual:   Desire for further Chaplaincy support:Yes  Additional Recommendations: Caregiving  Support/Resources  Prognosis:   If patient does not eat or drink prognosis is quite poor likely < 2 weeks  Discharge Planning: [place] for rehab with Palliative care service follow-up     Primary Diagnoses: Present on Admission: . Altered mental status  I have reviewed the medical record, interviewed the patient and family, and examined the patient. The following aspects are pertinent. Past Medical History:  Diagnosis Date  . Chronic pain   .  Colon polyp   . Current use of long term anticoagulation   . Diverticulitis of colon   . DVT (deep venous thrombosis) ([place])   . Esophagitis   . Gastritis   . Generalized osteoarthrosis, involving multiple sites   . Helicobacter pylori ab+   . High risk medication use   . Hypercholesterolemia   . Hyperlipidemia   . Hypertension    benign  . Iron deficiency anemia   . LBBB (left bundle branch block)   . Mixed hyperlipidemia   . Neoplasm of breast   . OSA (obstructive sleep apnea)   . Osteoarthritis of right knee   . Osteopenia   . Peripheral arterial disease ([place])   . Vitamin D deficiency    Social History   Socioeconomic History  . Marital status: Widowed    Spouse name: Not on file  . Number of children: Not on file  . Years of education: Not on file  . Highest education level: Not on file  Occupational History  . Not on file  Tobacco Use  . Smoking status: Never Smoker  Substance and Sexual Activity  . Alcohol use: No  . Drug use: No  . Sexual activity: Not on file  Other Topics Concern  . Not on file  Social History Narrative  . Not on file   Social Determinants of Health   Financial Resource Strain:   . Difficulty of Paying Living Expenses:   Food Insecurity:   . Worried About Charity fundraiser in the Last Year:   . Arboriculturist in the Last Year:    Transportation Needs:   . Film/video editor (Medical):   Marland Kitchen Lack of Transportation (Non-Medical):   Physical Activity:   . Days of Exercise per Week:   . Minutes of Exercise per Session:   Stress:   . Feeling of Stress :   Social Connections:   . Frequency of Communication with Friends and Family:   . Frequency of Social Gatherings with Friends and Family:   . Attends Religious Services:   . Active Member of Clubs or Organizations:   . Attends Archivist Meetings:   Marland Kitchen Marital Status:    Family History  Problem Relation Age of Onset  . CAD Mother   . Cancer Sister   . Heart disease Brother    Scheduled Meds: . enoxaparin (LOVENOX) injection  30 mg Subcutaneous Q24H  . feeding supplement (ENSURE ENLIVE)  237 mL Oral BID BM  . multivitamin with minerals  1 tablet Oral Daily  . potassium chloride  40 mEq Oral Q4H   Continuous Infusions: . valproate sodium 250 mg (07/05/19 0541)   PRN Meds:. Medications Prior to Admission:  Prior to Admission medications   Medication Sig Start Date End Date Taking? Authorizing Provider  acetaminophen (TYLENOL) 325 MG tablet Take 650 mg by mouth every 6 (six) hours as needed for mild pain.   Yes [provider]  amLODipine (NORVASC) 5 MG tablet Take 5 mg by mouth daily.   Yes [provider]  clopidogrel (PLAVIX) 75 MG tablet Take 75 mg by mouth daily.   Yes [provider]  divalproex (DEPAKOTE) 250 MG DR tablet Take 1 tablet (250 mg total) by mouth 3 (three) times daily. 06/18/19 07/18/19 Yes Caccavale, Sophia, PA-C  furosemide (LASIX) 40 MG tablet Take 40 mg by mouth 2 (two) times daily.   Yes [provider]  gabapentin (NEURONTIN) 600 MG tablet Take 600 mg  by mouth 3 (three) times daily.   Yes [provider]  hydrALAZINE (APRESOLINE) 10 MG tablet Take 10 mg by mouth 3 (three) times daily.   Yes [provider]  isosorbide dinitrate (ISORDIL) 30 MG tablet Take 30 mg by mouth  3 (three) times daily.   Yes [provider]  loratadine (CLARITIN) 10 MG tablet Take 10 mg daily by mouth.   Yes [provider]  metoprolol tartrate (LOPRESSOR) 50 MG tablet Take 50 mg by mouth 2 (two) times daily.   Yes [provider]  montelukast (SINGULAIR) 10 MG tablet Take 10 mg by mouth daily.    Yes [provider]  pantoprazole (PROTONIX) 40 MG tablet Take 40 mg by mouth daily.   Yes [provider]  rosuvastatin (CRESTOR) 20 MG tablet Take 20 mg daily by mouth.   Yes [provider]  triamterene-hydrochlorothiazide (DYAZIDE) 37.5-25 MG capsule Take 1 capsule daily by mouth.   Yes [provider]  amoxicillin (AMOXIL) 500 MG capsule Take 500 mg by mouth in the morning and at bedtime. For 5 days for UTI    [provider]   Allergies  Allergen Reactions  . Lisinopril     Other reaction(s): Cough (ALLERGY/intolerance)  . Salicylates Nausea And Vomiting   Review of Systems  Unable to perform ROS: Mental status change   Physical Exam Vitals and nursing note reviewed.  HENT:     Head: Normocephalic.     Nose: Nose normal.     Mouth/Throat:     Mouth: Mucous membranes are dry.  Eyes:     Pupils: Pupils are equal, round, and reactive to light.  Cardiovascular:     Rate and Rhythm: Normal rate and regular rhythm.  Pulmonary:     Effort: Pulmonary effort is normal.  Abdominal:     Palpations: Abdomen is soft.  Musculoskeletal:        General: Normal range of motion.  Skin:    General: Skin is warm.     Capillary Refill: Capillary refill takes less than 2 seconds.  Neurological:     Mental Status: She is alert. She is disoriented.   Vital Signs: BP 112/72 (BP Location: Left Arm)   Pulse (!) 51   Temp (!) 97.5 F (36.4 C) (Oral)   Resp 16   Ht 5' (1.524 m) Comment: deom 01/2019 encounter  Wt 63.4 kg   SpO2 96%   BMI 27.30 kg/m  Pain Scale: 0-10   Pain Score: 0-No pain SpO2: SpO2: 96 % O2  Device:SpO2: 96 % O2 Flow Rate: .  IO: Intake/output summary:   Intake/Output Summary (Last 24 hours) at 07/05/2019 1110 Last data filed at 07/05/2019 0816 Gross per 24 hour  Intake 534.71 ml  Output 150 ml  Net 384.71 ml  LBM:   Baseline Weight: Weight: 61.4 kg Most recent weight: Weight: 63.4 kg     Palliative Assessment/Data: 20%  Time In: 1100 Time Out: 1220 Time Total: 70 Greater than 50%  of this time was spent counseling and coordinating care related to the above assessment and plan.  Signed by: Rosezella Rumpf, NP   Please contact Palliative Medicine Team phone at [phone] for questions and concerns.  For individual provider: See Shea Evans

## 2019-07-05 NOTE — Progress Notes (Signed)
Patient taking sips of liquids only refusing meal trays sticks tongue out prevent staff sliding spoon in to feed her. So calorie count mute point with patient refusing to eat. She refused to take po pills crushed or whole tried applesauce,pudding,etc refused all.

## 2019-07-05 NOTE — Progress Notes (Signed)
Patient ate bites of supper tray 4 bites potatoes and 2 bites of her beans,sips of ensure and that is it. Very poor appetite and reports not interested in eating.

## 2019-07-05 NOTE — Progress Notes (Signed)
Calorie Count Note  48 hour calorie count ordered.  Diet: dysphagia 1 diet with thin liquids Supplements: Ensure Enlive po BID, each supplement provides 350 kcal and 20 grams of protein; Magic cup TID with meals, each supplement provides 290 kcal and 9 grams of protein; MVI with minerals daily  4/15 Lunch: 0% recorded Dinner: 0% recorded  Palliative care team has been consulted for discuss goals of care.   Nutrition Dx: Inadequate oral intake related to lethargy/confusion as evidenced by NPO status; progressing- advanced to dysphagia 1 diet with thin liquids  Goal: Patient will meet greater than or equal to 90% of their needs; unmet  Intervention:   -Continue dysphagia 1 diet with thin liquids -RD will follow for diet advancement and adjust supplement regimen as appropriate -Continue MVI with minerals daily -Continue Ensure Enlive po BID, each supplement provides 350 kcal and 20 grams of protein -Continue Magic cup TID with meals, each supplement provides 290 kcal and 9 grams of protein -Continue feeding assistance with meals -Initiate 48 hour calorie count per MD request; RD to follow-up with results on Monday, 07/08/19  Loistine Chance, RD, LDN, [place] Registered Dietitian II Certified Diabetes Care and Education Specialist Please refer to [facility] for RD and/or RD on-call/weekend/after hours pager

## 2019-07-05 NOTE — Plan of Care (Signed)
  Problem: Education: Goal: Knowledge of General Education information will improve Description: Including pain rating scale, medication(s)/side effects and non-pharmacologic comfort measures Outcome: Progressing   Problem: Health Behavior/Discharge Planning: Goal: Ability to manage health-related needs will improve Outcome: Progressing   Problem: Clinical Measurements: Goal: Ability to maintain clinical measurements within normal limits will improve Outcome: Progressing   Problem: Nutrition: Goal: Adequate nutrition will be maintained Outcome: Progressing   Problem: Skin Integrity: Goal: Risk for impaired skin integrity will decrease Outcome: Progressing   

## 2019-07-06 DIAGNOSIS — F0390 Unspecified dementia without behavioral disturbance: Secondary | ICD-10-CM | POA: Diagnosis not present

## 2019-07-06 DIAGNOSIS — Z515 Encounter for palliative care: Secondary | ICD-10-CM | POA: Diagnosis not present

## 2019-07-06 DIAGNOSIS — R531 Weakness: Secondary | ICD-10-CM | POA: Diagnosis not present

## 2019-07-06 DIAGNOSIS — Z7189 Other specified counseling: Secondary | ICD-10-CM | POA: Diagnosis not present

## 2019-07-06 DIAGNOSIS — N179 Acute kidney failure, unspecified: Secondary | ICD-10-CM | POA: Diagnosis not present

## 2019-07-06 DIAGNOSIS — R569 Unspecified convulsions: Secondary | ICD-10-CM | POA: Diagnosis not present

## 2019-07-06 DIAGNOSIS — Z66 Do not resuscitate: Secondary | ICD-10-CM | POA: Diagnosis not present

## 2019-07-06 LAB — BASIC METABOLIC PANEL
Anion gap: 11 (ref 5–15)
BUN: 48 mg/dL — ABNORMAL HIGH (ref 8–23)
CO2: 27 mmol/L (ref 22–32)
Calcium: 8 mg/dL — ABNORMAL LOW (ref 8.9–10.3)
Chloride: 100 mmol/L (ref 98–111)
Creatinine, Ser: 2.79 mg/dL — ABNORMAL HIGH (ref 0.44–1.00)
GFR calc Af Amer: 16 mL/min — ABNORMAL LOW (ref >60)
GFR calc non Af Amer: 14 mL/min — ABNORMAL LOW (ref >60)
Glucose, Bld: 80 mg/dL (ref 70–99)
Potassium: 3.5 mmol/L (ref 3.5–5.1)
Sodium: 138 mmol/L (ref 135–145)

## 2019-07-06 LAB — CBC WITH DIFFERENTIAL/PLATELET
Abs Immature Granulocytes: 0.12 10*3/uL — ABNORMAL HIGH (ref 0.00–0.07)
Basophils Absolute: 0 10*3/uL (ref 0.0–0.1)
Basophils Relative: 0 %
Eosinophils Absolute: 0 10*3/uL (ref 0.0–0.5)
Eosinophils Relative: 0 %
HCT: 27.8 % — ABNORMAL LOW (ref 36.0–46.0)
Hemoglobin: 9.1 g/dL — ABNORMAL LOW (ref 12.0–15.0)
Immature Granulocytes: 1 %
Lymphocytes Relative: 10 %
Lymphs Abs: 1.7 10*3/uL (ref 0.7–4.0)
MCH: 29 pg (ref 26.0–34.0)
MCHC: 32.7 g/dL (ref 30.0–36.0)
MCV: 88.5 fL (ref 80.0–100.0)
Monocytes Absolute: 1.6 10*3/uL — ABNORMAL HIGH (ref 0.1–1.0)
Monocytes Relative: 9 %
Neutro Abs: 14 10*3/uL — ABNORMAL HIGH (ref 1.7–7.7)
Neutrophils Relative %: 80 %
Platelets: 192 10*3/uL (ref 150–400)
RBC: 3.14 MIL/uL — ABNORMAL LOW (ref 3.87–5.11)
RDW: 16 % — ABNORMAL HIGH (ref 11.5–15.5)
WBC: 17.4 10*3/uL — ABNORMAL HIGH (ref 4.0–10.5)
nRBC: 0.2 % (ref 0.0–0.2)

## 2019-07-06 MED ORDER — DIVALPROEX SODIUM 125 MG PO CSDR
250.0000 mg | DELAYED_RELEASE_CAPSULE | Freq: Three times a day (TID) | ORAL | Status: DC
  Administered 2019-07-06 – 2019-07-09 (×9): 250 mg via ORAL
  Filled 2019-07-06 (×8): qty 2

## 2019-07-06 MED ORDER — DIVALPROEX SODIUM 250 MG PO DR TAB: 250.0000 mg | DELAYED_RELEASE_TABLET | Freq: Three times a day (TID) | ORAL | Status: DC

## 2019-07-06 NOTE — Progress Notes (Signed)
Chaplain responded to spiritual care consult.  Pt can respond only by nodding and the slightest whisper. When offered, pt accepted a bite of apple sauce and sip of protein drink, but nothing more.  Appears very weak.  She accepted prayer.    Please calls as needed for f/u care.  Luana Shu [phone]    07/06/19 1200  Clinical Encounter Type  Visited With Patient  Visit Type Initial;Spiritual support  Referral From Nurse  Consult/Referral To Chaplain  Spiritual Encounters  Spiritual Needs Prayer  Stress Factors  Patient Stress Factors Health changes

## 2019-07-06 NOTE — Plan of Care (Signed)

## 2019-07-06 NOTE — Hospital Course (Addendum)
Judy Aguilar is a 86 y.o. female presenting with mental decline and weakness. PMH is significant for HFrEF with EF 20-25%, previous TIA, right-sided MCA infarct 05/01/2019 that is post lobectomy on Plavix, CKD, HTN, HLD, GERD, breast cancer (2004) status postmastectomy, +/- h/o seizures on Depakote, OSA, DVT?Marland Kitchen   Generalized weakness, progressive cognitive decline Multifactorial considering dehydration/malnutrition, vascular dementia with stroke in 2/21, HFrEF, anemia and CKD IV.  Palliative care was consulted and discussion with family resulted in the decision to***. Patient was evaluted at [place] and placed on dysphagia diet with 1 on 1 feeding.

## 2019-07-06 NOTE — Progress Notes (Signed)
Family Medicine Teaching Service Daily Progress Note Intern Pager: [phone]  Patient name: Judy Aguilar Medical record number: [id number] Date of birth: 05/26/1926 Age: 86 y.o. Gender: female  Primary Care Provider: Sandi Mariscal, MD Consultants: None Code Status: DNR  Pt Overview and Major Events to Date:  04/14-Admitted  Assessment and Plan: Judy Aguilar is a 86 y.o. female presenting with mental decline and weakness. PMH is significant for HFrEF with EF 20-25%, previous TIA, right-sided MCA infarct 05/01/2019 that is post lobectomy on Plavix, CKD, HTN, HLD, GERD, breast cancer (2004) status postmastectomy, +/- h/o seizures on Depakote, OSA, DVT?Marland Kitchen   Generalized weakness, progressive cognitive decline: Stable. Likely multifactorial due to dehydration/malnutrition in setting of vascular dementia/recent CVA in 04/2019, known HFrEF, anemia, CKD stage IV.  No evidence of precipitating infection at this time.  Continues to have lack of appetite with poor oral intake, generally still weak throughout but alert.   -Appreciated palliative/goals of care care conversation with family yesterday, will continue to monitor status with further discussions over the weekend and support patient in feeding/alertness -Dietitian on board, appreciate assistance with nutritional supplementation (Ensure, magic cup, MVI)  -Dysphagia 1 diet, 1:1 feeding -Delirium precautions, PT/OT, will attempt to discontinue IV -Monitor CBC, BMP  AKI on CKD: Improving. Cr 2.79 from 3.1 yesterday.  BL 2.1-2.3. -Encourage p.o. intake/fluids -Monitor BMP  Hypokalemia: Resolved. K 3.5 today.  -Monitor BMP  Severe HFrEF: Chronic, stable. EF 20-25% in 04/2019.  Unlabored breathing, appears euvolemic.  -Daily weights, strict I&O's -Continue to hold home amlodipine, Crestor, Lopressor, isosorbide/hydralazine, Lasix due to lower blood pressures/AKI  Leukocytosis: Stable. WBC 17.4 with neutrophil predominance, from 19.7 yesterday.   Reassuringly downtrending, has not been on antibiotics.  No known source of infectious etiology, remains afebrile. -Monitor CBC -Monitor for infectious S/sx  Normocytic anemia: Chronic, stable. Hgb 9.1 around baseline.  Likely in the setting of chronic disease. -Monitor CBC  Hypertension: Chronic, stable. Remaining low-normotensive (SBP 95-110) without any antihypertensives on board.  -Continue to hold home amlodipine, Lopressor, isosorbide/hydralazine, triamterene-HCTZ -Monitor BP  ?Seizure activity history: Started on Depakote on 3/30 during previous ED visit due to suspicion for seizure.  EEG at that time revealed encephalopathy without seizure-like activity. -Transition IV Depakote to p.o. if efforts to minimize invasive/delirium provoking agents  OSA Chronic.  Stable.  Patient on room air no desaturations overnight. -Continue to monitor  Right Rib Fracture: Right anterior ninth rib fracture found on chest x-ray at admission.  Normal respirations, maintains oxygen saturations on room air.  No complaints of pain. -Continue to monitor  FEN/GI:  -Dysphagia 1, thin liquid  Prophylaxis:  -Lovenox  Disposition: Continue to monitor with palliative conversations, likely will return to SNF early next week  Subjective:  No acute events overnight.  States she is doing well this morning, knows her name and place.  Unsure of time.  Denies any pain.   Objective: Temp:  [97.8 F (36.6 C)-98.8 F (37.1 C)] 97.8 F (36.6 C) (04/17 0419) Pulse Rate:  [71-80] 71 (04/17 0419) Resp:  [16-18] 18 (04/17 0419) BP: (100-117)/(58-65) 117/65 (04/17 0419) SpO2:  [97 %-100 %] 97 % (04/17 0419) Weight:  [62.6 kg] 62.6 kg (04/17 0419) Physical Exam: General: Older female who appears younger than stated age sitting comfortably, NAD Cardiovascular: Irregular rhythm, normal rate, no murmurs appreciated Respiratory: CTA bilaterally, unlabored breathing  Gastrointestinal: Soft Extremities: no  lower extremity edema Neuro: Alert and oriented to person and place, unclear on the year.  Able to follow commands.  Can move all extremities spontaneously without any focal deficits.  Laboratory: Recent Labs  Lab 07/04/19 0104 07/04/19 0833 07/05/19 0436 07/06/19 0318  WBC 19.0*  --  19.7* 17.4*  HGB 9.6*  --  10.2* 9.1*  HCT 28.6* 28.7* 31.2* 27.8*  PLT 202  --  220 192   Recent Labs  Lab 07/03/19 1022 07/03/19 1740 07/04/19 0104 07/05/19 0436 07/06/19 0318  NA   < >  --  139 143 138  K   < >  --  3.8 3.3* 3.5  CL   < >  --  97* 100 100  CO2   < >  --  27 26 27   BUN   < >  --  64* 56* 48*  CREATININE   < >  --  3.52* 3.19* 2.79*  CALCIUM   < >  --  8.3* 8.5* 8.0*  PROT  --  7.9  --   --   --   BILITOT  --  0.7  --   --   --   ALKPHOS  --  79  --   --   --   ALT  --  11  --   --   --   AST  --  24  --   --   --   GLUCOSE   < >  --  71 61* 80   < > = values in this interval not displayed.    Imaging/Diagnostic Tests: No results found.  Judy Clan, DO 07/06/2019, 10:04 AM PGY-2, [place] Intern pager: [phone], text pages welcome

## 2019-07-06 NOTE — Progress Notes (Signed)
Palliative Medicine Inpatient Follow Up Note   HPI: 86 Y/O F with PMX of Vit D deficiency, PAD, OSA, HLD, HTN, Anemia, recent admission for CVA and possible seizure, brought in for progressive weakness.   Palliative care was asked to address goals of care in the setting of HFrEF 20% and FTT  Today's Discussion (07/06/2019): Chart reviewed. I met with Judy Aguilar at bedside. She remains disoriented though did request some ice water which she was able to drink well without signs of aspiration.  I spoke to Judy Aguilar this afternoon to provide her an update. I shared that her mother remains weak and has little appetite. Judy Aguilar said that she and her brother had gotten together yesterday and share wonderful memories of their mother. She said that they are still hopeful for possible improvement. She remains cautiously optimistic. Judy Aguilar plans to visit tomorrow around [facility]. We plan to meet at that time.   Discussed the importance of continued conversation with family and their  medical providers regarding overall plan of care and treatment options, ensuring decisions are within the context of the patients values and GOCs.  Questions and concerns addressed   Vital Signs Vitals:   07/05/19 2120 07/06/19 0419  BP: (!) 113/58 117/65  Pulse: 76 71  Resp: 16 18  Temp: 98.8 F (37.1 C) 97.8 F (36.6 C)  SpO2: 100% 97%    Intake/Output Summary (Last 24 hours) at 07/06/2019 1136 Last data filed at 07/06/2019 0751 Gross per 24 hour  Intake 2086.62 ml  Output 575 ml  Net 1511.62 ml   Last Weight  Most recent update: 07/06/2019  4:39 AM   Weight  62.6 kg (138 lb 1.6 oz)           SUMMARY OF RECOMMENDATIONS   DNAR/DNI  No feeding tubes  Treat the treatable  Time trial of present treatments to identify if improvements are made. Initially set trial of 48 hours but family will likely extend this  TOC --> Referral to hospice for informational purposes  Chaplain Support  Code  Status/Advance Care Planning:  DNR  Symptom Management: Muscular Weakness: - Physical Therapy Evaluation - Occupational Therapy Evaluation  Failure to Thrive: Dysphagia: - Nutrition                                 * Ensure Enlive BID                                 * Magic cup TID                                 * Multivitamin daily                 - Speech involved on dysphagia 1 diet - Encourage 1:1 feeding - Out of bed for meals - Aspiration precautions  Oral Care: - QShift oral assessment and management  Delirium: - Delirium precautions - Get up during the day - Encourage a familiar face to remain present throughout the day - Keep blinds open and lights on during daylight hours - Minimize the use of opioids/benzodiazepines  Spiritual: - Chaplain consult  Time Spent: 25 Greater than 50% of the time was spent in counseling and coordination of care ______________________________________________________________________________________ [place] Team Team Cell  Phone: [phone] Please utilize secure chat with additional questions, if there is no response within 30 minutes please call the above phone number  Palliative Medicine Team providers are available by phone from 7am to 7pm daily and can be reached through the team cell phone.  Should this patient require assistance outside of these hours, please call the patient's attending physician.

## 2019-07-07 DIAGNOSIS — Z7189 Other specified counseling: Secondary | ICD-10-CM | POA: Diagnosis not present

## 2019-07-07 DIAGNOSIS — R531 Weakness: Secondary | ICD-10-CM | POA: Diagnosis not present

## 2019-07-07 DIAGNOSIS — F0390 Unspecified dementia without behavioral disturbance: Secondary | ICD-10-CM | POA: Diagnosis not present

## 2019-07-07 DIAGNOSIS — Z515 Encounter for palliative care: Secondary | ICD-10-CM | POA: Diagnosis not present

## 2019-07-07 DIAGNOSIS — N179 Acute kidney failure, unspecified: Secondary | ICD-10-CM | POA: Diagnosis not present

## 2019-07-07 DIAGNOSIS — Z66 Do not resuscitate: Secondary | ICD-10-CM | POA: Diagnosis not present

## 2019-07-07 DIAGNOSIS — E860 Dehydration: Secondary | ICD-10-CM | POA: Diagnosis not present

## 2019-07-07 LAB — CBC
HCT: 30.8 % — ABNORMAL LOW (ref 36.0–46.0)
Hemoglobin: 10.1 g/dL — ABNORMAL LOW (ref 12.0–15.0)
MCH: 29.9 pg (ref 26.0–34.0)
MCHC: 32.8 g/dL (ref 30.0–36.0)
MCV: 91.1 fL (ref 80.0–100.0)
Platelets: 194 10*3/uL (ref 150–400)
RBC: 3.38 MIL/uL — ABNORMAL LOW (ref 3.87–5.11)
RDW: 16 % — ABNORMAL HIGH (ref 11.5–15.5)
WBC: 17.8 10*3/uL — ABNORMAL HIGH (ref 4.0–10.5)
nRBC: 0 % (ref 0.0–0.2)

## 2019-07-07 LAB — BASIC METABOLIC PANEL
Anion gap: 12 (ref 5–15)
BUN: 44 mg/dL — ABNORMAL HIGH (ref 8–23)
CO2: 24 mmol/L (ref 22–32)
Calcium: 8.4 mg/dL — ABNORMAL LOW (ref 8.9–10.3)
Chloride: 104 mmol/L (ref 98–111)
Creatinine, Ser: 2.46 mg/dL — ABNORMAL HIGH (ref 0.44–1.00)
GFR calc Af Amer: 19 mL/min — ABNORMAL LOW (ref >60)
GFR calc non Af Amer: 17 mL/min — ABNORMAL LOW (ref >60)
Glucose, Bld: 87 mg/dL (ref 70–99)
Potassium: 3.4 mmol/L — ABNORMAL LOW (ref 3.5–5.1)
Sodium: 140 mmol/L (ref 135–145)

## 2019-07-07 LAB — MAGNESIUM: Magnesium: 2.4 mg/dL (ref 1.7–2.4)

## 2019-07-07 MED ORDER — NYSTATIN 100000 UNIT/ML MT SUSP
5.0000 mL | Freq: Four times a day (QID) | OROMUCOSAL | Status: DC
  Administered 2019-07-07 – 2019-07-09 (×8): 500000 [IU] via ORAL
  Filled 2019-07-07 (×8): qty 5

## 2019-07-07 NOTE — Progress Notes (Signed)
Family Medicine Teaching Service Daily Progress Note Intern Pager: [phone]  Patient name: Judy Aguilar Medical record number: [id number] Date of birth: 1926-11-20 Age: 86 y.o. Gender: female  Primary Care Provider: Sandi Mariscal, MD Consultants: None Code Status: DNR  Pt Overview and Major Events to Date:  04/14-Admitted  Assessment and Plan: Judy Aguilar is a 86 y.o. female presenting with mental decline and weakness. PMH is significant for HFrEF with EF 20-25%, previous TIA, right-sided MCA infarct 05/01/2019 that is post lobectomy on Plavix, CKD, HTN, HLD, GERD, breast cancer (2004) status postmastectomy, +/- h/o seizures on Depakote, OSA, DVT?Marland Kitchen   Generalized weakness, progressive cognitive decline: Stable. Continues to have generalized weakness and poor oral intake.  She is alert and oriented to person, place and year.   -Palliative care following -Dietitian following -Encourage oral hydration -Dysphasia 1 diet, total feed -Delirium precautions -PT/OT -CBC, BMP q2days  AKI on CKD: Improving. CR 2.49 today.  Downtrending since admission. Baseline 2.1-3.1 -Encourage oral hydration -BMP q2days  Severe HFrEF: Chronic, stable. EF 20-25%(2021).  Euvolemic on exam. -Daily Weights, and I's&O's -Continue to hold home medications in the setting of AKI  Leukocytosis: Stable. WBC 17.8 -CBC q2 -Monitor for infectious signs and symptoms   Normocytic anemia: Chronic, stable. Hbg 10.1.  Baseline around 9.1 -CBC q2d  Hypertension: Chronic, stable. Normotensive without antihypertensives.   -Continue to hold home Amlodipine, Lopressor,Isosorbide/Hydralazine, Triamterene-HCTZ -Continue to monitor  ?Seizure activity history: No seizure activity overnight.  Home medication Depakote 250 mg TID.   -Continue Depakote 250 mg po -Continue to monitor  OSA Chronic. Stable.  Continues to be on room air and no desaturations overnight -Continue to monitor  Right Rib Fracture: Right  anterior 9th rib fracture, incidental finding on chest xray. No complaints of pain.  NO increased work of breathing. -Continue to monitor  FEN/GI:  -Dysphagia 1, thin liquid  Prophylaxis:  -Lovenox  Disposition: Continue to monitor with palliative conversations, likely will return to SNF early next week  Subjective:  No acute events overnight.  At baseline.  Denies any chest pain, SOB, n/v or abdominal pain.     Objective: Temp:  [98.2 F (36.8 C)-98.7 F (37.1 C)] 98.7 F (37.1 C) (04/18 0441) Pulse Rate:  [74-78] 76 (04/18 0441) Resp:  [16-18] 18 (04/18 0441) BP: (105-114)/(48-84) 114/56 (04/18 0441) SpO2:  [98 %-100 %] 98 % (04/18 0441) Weight:  [63 kg] 63 kg (04/18 0441) Physical Exam:  General: Alert and oriented, no apparent distress Cardiovascular: RRR with no murmurs noted Respiratory: CTA bilaterally  Gastrointestinal: Bowel sounds present. No abdominal pain Extremities:  No lower extremity edema  Neuro: Follows commands.  At baseline   Laboratory: Recent Labs  Lab 07/04/19 0104 07/04/19 0833 07/05/19 0436 07/06/19 0318  WBC 19.0*  --  19.7* 17.4*  HGB 9.6*  --  10.2* 9.1*  HCT 28.6* 28.7* 31.2* 27.8*  PLT 202  --  220 192   Recent Labs  Lab 07/03/19 1022 07/03/19 1740 07/04/19 0104 07/05/19 0436 07/06/19 0318  NA   < >  --  139 143 138  K   < >  --  3.8 3.3* 3.5  CL   < >  --  97* 100 100  CO2   < >  --  27 26 27   BUN   < >  --  64* 56* 48*  CREATININE   < >  --  3.52* 3.19* 2.79*  CALCIUM   < >  --  8.3* 8.5* 8.0*  PROT  --  7.9  --   --   --   BILITOT  --  0.7  --   --   --   ALKPHOS  --  79  --   --   --   ALT  --  11  --   --   --   AST  --  24  --   --   --   GLUCOSE   < >  --  71 61* 80   < > = values in this interval not displayed.    Imaging/Diagnostic Tests: No results found.  Carollee Leitz, MD 07/07/2019, 7:33 AM PGY-1, [place] Intern pager: [phone], text pages welcome

## 2019-07-07 NOTE — Progress Notes (Signed)
Patient is more alert and following simple commands drinking fresh ice water and vanilla ensures. Noticed White coating on tongue MD notified and order antifungal mouth wash.

## 2019-07-07 NOTE — Progress Notes (Addendum)
Palliative Medicine Inpatient Follow Up Note   HPI: 86 Y/O F with PMX of Vit D deficiency, PAD, OSA, HLD, HTN, Anemia, recent admission for CVA and possible seizure, brought in for progressive weakness.   Palliative care was asked to address goals of care in the setting of HFrEF 20% and FTT  Today's Discussion (07/07/2019): Chart reviewed. Met with Judy Aguilar at bedside, she was more conversant today. I shared with her that her son and daughter will be here this afternoon. She said that she looks forward to that. We discussed her need to mobilize more than she has been. Per discussion with her bedside nurse she has been drinking more. She has been started on treatment for thrush.   I communicated with Shiley this afternoon, we plan to meet in the early evening.   Discussed the importance of continued conversation with family and their  medical providers regarding overall plan of care and treatment options, ensuring decisions are within the context of the patients values and GOCs.  Questions and concerns addressed   Addendum: I met with the Judy Aguilar family this afternoon. We discussed her current state. They feel encouraged by her increase in appetite and would like to pursue a trial of skilled nursing to see if she may be able to get stronger. They are very realistic about her advanced heart failure and recognize that hospice will be the likely next step if she does not improve.  Vital Signs Vitals:   07/06/19 2005 07/07/19 0441  BP: 105/84 (!) 114/56  Pulse: 78 76  Resp: 16 18  Temp: 98.2 F (36.8 C) 98.7 F (37.1 C)  SpO2: 99% 98%    Intake/Output Summary (Last 24 hours) at 07/07/2019 1309 Last data filed at 07/07/2019 0847 Gross per 24 hour  Intake 340 ml  Output 650 ml  Net -310 ml   Last Weight  Most recent update: 07/07/2019  5:15 AM   Weight  63 kg (138 lb 14.4 oz)           SUMMARY OF RECOMMENDATIONS   DNAR/DNI  No feeding tubes  Treat the treatable  Continue  current course. Family would like for patient to transition to SNF if she continues to make improvements  TOC --> Referral for OP Palliative care  Chaplain Support  Code Status/Advance Care Planning:  DNR  Symptom Management: Muscular Weakness: - Physical Therapy Evaluation - Occupational Therapy Evaluation  Failure to Thrive: Dysphagia: - Nutrition                                 * Ensure Enlive BID --> patient is enjoying this                                 * Magic cup TID                                 * Multivitamin daily                 - Speech involved on dysphagia 1 diet - Encourage 1:1 feeding - Out of bed for meals - Aspiration precautions  Oral Care: - QShift oral assessment and management  Delirium: - Delirium precautions - Get up during the day - Encourage a familiar face to remain present throughout the day -  Keep blinds open and lights on during daylight hours - Minimize the use of opioids/benzodiazepines  Spiritual: - Chaplain consult  Time Spent: 35 Greater than 50% of the time was spent in counseling and coordination of care ______________________________________________________________________________________ [place] Team Team Cell Phone: [phone] Please utilize secure chat with additional questions, if there is no response within 30 minutes please call the above phone number  Palliative Medicine Team providers are available by phone from 7am to 7pm daily and can be reached through the team cell phone.  Should this patient require assistance outside of these hours, please call the patient's attending physician.

## 2019-07-08 DIAGNOSIS — R4182 Altered mental status, unspecified: Secondary | ICD-10-CM | POA: Diagnosis not present

## 2019-07-08 DIAGNOSIS — E860 Dehydration: Secondary | ICD-10-CM | POA: Diagnosis not present

## 2019-07-08 DIAGNOSIS — F0390 Unspecified dementia without behavioral disturbance: Secondary | ICD-10-CM | POA: Diagnosis not present

## 2019-07-08 DIAGNOSIS — N179 Acute kidney failure, unspecified: Secondary | ICD-10-CM | POA: Diagnosis not present

## 2019-07-08 LAB — GLUCOSE, CAPILLARY: Glucose-Capillary: 111 mg/dL — ABNORMAL HIGH (ref 70–99)

## 2019-07-08 LAB — SARS CORONAVIRUS 2 (TAT 6-24 HRS): SARS Coronavirus 2: NEGATIVE

## 2019-07-08 NOTE — Progress Notes (Signed)
Physical Therapy Treatment Patient Details Name: Judy Aguilar MRN: [id number] DOB: December 05, 1926 Today's Date: 07/08/2019    History of Present Illness 86 y.o. female presenting with mental decline and weakness. 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 intake over the last 2 weeks. Admitted 07/03/19 for treatment of AKI, hypokalemia, leukocytosis, anemia and severe HFrEF. Incidental finding of mildly displaced R anterior 9th rib fx     PT Comments    Pt laying in bed staring off into distance on entry, agreeable to getting OOB with therapy today. Pt cognition is improved and pt able to carry on conversation and follow commands. Pt is limited in safe mobility mainly by generalized weakness. Pt is min A for coming to EoB and requires modA for power up and stepping transfer to recliner. Pt unable to utilize RW for support as she is unable to come to fully upright. D/c plans remain appropriate at this time. PT will continue to follow acutely.      Follow Up Recommendations  SNF     Equipment Recommendations  None recommended by PT       Precautions / Restrictions Precautions Precautions: Fall Restrictions Weight Bearing Restrictions: No    Mobility  Bed Mobility Overal bed mobility: Needs Assistance Bed Mobility: Supine to Sit     Supine to sit: Min assist;HOB elevated     General bed mobility comments: minA for initiating LE movement to EoB and for scooting hips forward on bed   Transfers Overall transfer level: Needs assistance Equipment used: 1 person hand held assist Transfers: Sit to/from [facility] Sit to Stand: Mod  assist Stand pivot transfers: Mod assist       General transfer comment: attempted to use RW for sit>stand however pt did not have enough strength to power up to walker and extend trunk, with modA to power up and therapist assist at hips and blocking of bilateral knees pt able to make small shuffling steps to recliner, increased cuing required for reaching to armrests of recliner        Balance Overall balance assessment: Needs assistance Sitting-balance support: Feet supported;No upper extremity supported;Bilateral upper extremity supported;Single extremity supported Sitting balance-Leahy Scale: Poor     Standing balance support: Bilateral upper extremity supported;During functional activity Standing balance-Leahy Scale: Zero                              Cognition Arousal/Alertness: Lethargic Behavior During Therapy: Flat affect Overall Cognitive Status: History of cognitive impairments - at baseline                                           General Comments General comments (skin integrity, edema, etc.): VSS on RA      Pertinent Vitals/Pain Pain Assessment: No/denies pain           PT Goals (current goals can now be found in the care plan section) Acute Rehab PT Goals Patient Stated Goal: sit in the sun PT Goal Formulation: Patient unable to participate in goal setting Time For  Goal Achievement: 07/18/19 Potential to Achieve Goals: Good    Frequency    Min 2X/week      PT Plan Current plan remains appropriate       AM-PAC PT "6 Clicks" Mobility   Outcome Measure  Help needed turning from your back to your side while in a flat bed without using bedrails?: A Little Help needed moving from lying on your back to sitting on the side of a flat bed without using bedrails?: A Lot Help needed moving to and from a bed to a chair (including a wheelchair)?: A Lot Help needed standing up from a chair using your arms (e.g., wheelchair or  bedside chair)?: A Lot Help needed to walk in hospital room?: Total Help needed climbing 3-5 steps with a railing? : Total 6 Click Score: 11    End of Session Equipment Utilized During Treatment: Gait belt Activity Tolerance: Patient tolerated treatment well Patient left: in chair;with call bell/phone within reach;with chair alarm set Nurse Communication: Mobility status PT Visit Diagnosis: Unsteadiness on feet (R26.81);Other abnormalities of gait and mobility (R26.89);Muscle weakness (generalized) (M62.81);Difficulty in walking, not elsewhere classified (R26.2)     Time: 9147-8295 PT Time Calculation (min) (ACUTE ONLY): 14 min  Charges:  $Therapeutic Activity: 8-22 mins                     Devontaye Ground B. Migdalia Dk PT, DPT Acute Rehabilitation Services Pager [phone] Office [phone]    [place] 07/08/2019, 4:38 PM

## 2019-07-08 NOTE — Progress Notes (Signed)
Calorie Count Note  RD working remotely.  48 hour calorie count ordered.  Diet: dysphagia 1 diet with thin liquids Supplements: Ensure Enlive po BID, each supplement provides 350 kcal and 20 grams of protein; Magic cup TID with meals, each supplement provides 290 kcal and 9 grams of protein; MVI with minerals daily  Per palliative care notes, pt family is encouraged by pt's improvement over the past 24-48 hours. They do not desire any means of artificial nutrition or hydration. Pt enjoys vanilla Ensure Enlive supplements. Plan to d/c to SNF once medically stable.   4/16 Breakfast: 0% Lunch: 0%  Dinner: 0%  Supplements: 1 Ensure Enlive (350 kcals and 20 grams protein)  Total intake: 350 kcal (23% of minimum estimated needs)  20 grams protein (29% of minimum estimated needs)  4/17 Breakfast: 39 kcals, 2 grams protein Lunch: 0% Dinner: 40 kcals, 2 grams protein Supplements: 2 Ensure Enlive (700 kcals and 40 grams protein)  Total intake: 779 kcal (50% of minimum estimated needs)  44 grams protein (63% of minimum estimated needs)  4/18 Breakfast: 201 kcals, 7 grams protein Lunch: 204 kcals, 9 grams protein Dinner: 240 kcals, 9 grams protein Supplements: 2 Ensure Enlives (700 kcals and 40 grams protein)  Total intake: 1345 kcal (87% of minimum estimated needs)  65 grams protein (93% of minimum estimated needs)  Average Total intake: 825 kcal (53% of minimum estimated needs)  43 grams protein (61% of minimum estimated needs)  Nutrition Dx: Inadequate oral intakerelated to lethargy/confusionas evidenced by NPO status; progressing- advanced to dysphagia 1 diet with thin liquids  Goal: Patient will meet greater than or equal to 90% of their needs; progressing  Intervention:   -D/c calorie count -Continue dysphagia 1 diet with thin liquids -RD will follow for diet advancement and adjust supplement regimen as appropriate -Continue MVI with minerals daily -Continue Ensure  Enlive po BID, each supplement provides 350 kcal and 20 grams of protein -Continue Magic cup TID with meals, each supplement provides 290 kcal and 9 grams of protein -Continue feeding assistance with meals  Loistine Chance, RD, LDN, [place] Registered Dietitian II Certified Diabetes Care and Education Specialist Please refer to [facility] for RD and/or RD on-call/weekend/after hours pager

## 2019-07-08 NOTE — Progress Notes (Signed)
Family Medicine Teaching Service Daily Progress Note Intern Pager: [phone]  Patient name: Judy Aguilar Medical record number: [id number] Date of birth: Oct 29, 1926 Age: 86 y.o. Gender: female  Primary Care Provider: Sandi Mariscal, MD Consultants: None Code Status: DNR  Pt Overview and Major Events to Date:  04/14-Admitted  Assessment and Plan: Judy Aguilar is a 85 y.o. female presenting with mental decline and weakness. 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   Generalized weakness, progressive cognitive decline: Stable. Some, but minimal improvement since admission.  No reversible etiologies identified.  Palliative care working closely with family, will transition to SNF placement to monitor for any further progress. -Palliative care on board, appreciate assistance and recommendations -Dietitian on board, appreciate care with nutritional supplementation -Dysphagia 1 diet, 1: 1 feeding -Delirium precautions -Lab holidays, every 2 days -SW for SNF placement -Ensure referral to OP palliative care on DC  AKI on CKD: Improving. CR 2.46 on 4/18.  Downtrending since admission. Baseline 2.1-3.1 -Encourage oral hydration  -BMP q2days  Severe HFrEF: Chronic, stable. EF 20-25%(2021).  Euvolemic on exam. -Daily Weights, and I&O's -Continue to hold home medications in the setting of AKI/normotension   Leukocytosis: Stable. WBC 17.8, has down trended since admission.  Unclear etiology. -Monitor CBC every 2 days -Continue to monitor for infectious S/Sx, work-up nonrevealing thus far  Normocytic anemia: Chronic, stable. Hbg 10.1.  Baseline around 9.1 -CBC every 2 days  Hypertension: Chronic, stable. Normotensive without antihypertensives.   -Continue to hold home Amlodipine, Lopressor,  Isosorbide/Hydralazine, Triamterene-HCTZ -Continue to monitor  ?Seizure activity history: No seizures during hospitalization. -Continue Depakote 250 mg TID -Continue to monitor  OSA: Chronic, stable. Will offer CPAP nightly. -Continue to monitor  Right Rib Fracture: Stable. Right anterior 9th rib fracture, incidental finding on chest xray without significant displacement.  Patient with unlabored breathing, no associated pain.  -Continue to monitor  FEN/GI:  -Dysphagia 1, thin liquid  Prophylaxis:  -Lovenox  Disposition: Continue to monitor with palliative conversations, likely will return to SNF early next week  Subjective:  No acute events overnight.  Says she feels like she is doing better today, eating some.  Still has a poor appetite.    Objective: Temp:  [98.1 F (36.7 C)-98.4 F (36.9 C)] 98.1 F (36.7 C) (04/19 0427) Pulse Rate:  [76-82] 76 (04/19 0427) Resp:  [17-21] 17 (04/19 0427) BP: (114-133)/(59-69) 133/59 (04/19 0427) SpO2:  [96 %-98 %] 97 % (04/19 0427) Weight:  [64 kg] 64 kg (04/19 0300)  Physical Exam: General: Older female in no acute distress, resting comfortably HEENT: NCAT, dry mucous membranes Cardiac: Irregular rhythm, regular rate.  No murmur appreciated. Lungs: Clear bilaterally, no increased WOB  Abdomen: soft, non-tender, non-distended, normoactive BS Neuro: Alert and oriented to person, place.  States it is 2022.  Speech normal.  Follows complex commands appropriately, moves all extremities spontaneously.  No focal neuro deficits noted. Ext: Warm, dry, minimal pedal edema   Laboratory: Recent Labs  Lab 07/05/19 0436 07/06/19 0318 07/07/19 0750  WBC 19.7* 17.4* 17.8*  HGB 10.2* 9.1* 10.1*  HCT 31.2* 27.8* 30.8*  PLT 220 192 194   Recent Labs  Lab 07/03/19 1740 07/04/19 0104 07/05/19 0436 07/06/19 0318 07/07/19 0750  NA  --    < > 143 138 140  K  --    < >  3.3* 3.5 3.4*  CL  --    < > 100 100 104  CO2  --    < > 26 27 24    BUN  --    < > 56* 48* 44*  CREATININE  --    < > 3.19* 2.79* 2.46*  CALCIUM  --    < > 8.5* 8.0* 8.4*  PROT 7.9  --   --   --   --   BILITOT 0.7  --   --   --   --   ALKPHOS 79  --   --   --   --   ALT 11  --   --   --   --   AST 24  --   --   --   --   GLUCOSE  --    < > 61* 80 87   < > = values in this interval not displayed.    Imaging/Diagnostic Tests: No results found.  Judy Clan, DO 07/08/2019, 7:43 AM PGY-2, [place] Intern pager: [phone], text pages welcome

## 2019-07-08 NOTE — Plan of Care (Signed)

## 2019-07-08 NOTE — TOC Initial Note (Addendum)
Transition of Care [facility]) - Initial/Assessment Note    Patient Details  Name: Judy Aguilar MRN: [id number] Date of Birth: Feb 04, 1927  [facility]) CM/SW Contact:    Alberteen Sam, LCSW Phone Number: 07/08/2019, 9:51 AM  Clinical Narrative:                  CSW spoke with patient's daughter Enid Derry regarding discharge planning, in agreement with patient returning to [place] with outpatient palliative.   CSW made outpatient palliative referral to Audrea Muscat with [facility].   CSW spoke with Tammy with [facility] to inform of outpatient palliative services to follow at dc. She also requests updated COVID test and insurance auth.   CSW has initiated insurance auth today with [facility]. Reference # [id number]. Messaged MD for updated covid test order.  Expected Discharge Plan: Skilled Nursing Facility Barriers to Discharge: Continued Medical Work up   Patient Goals and CMS Choice   CMS Medicare.gov Compare Post Acute Care list provided to:: Patient Represenative (must comment)(daughter Enid Derry) Choice offered to / list presented to : Adult Children  Expected Discharge Plan and Services Expected Discharge Plan: [place] Acute Care Choice: [place] Living arrangements for the past 2 months: Skilled Nursing Facility([facility])                                      Prior Living Arrangements/Services Living arrangements for the past 2 months: Skilled Nursing Facility([facility]) Lives with:: Self Patient language and need for interpreter reviewed:: Yes Do you feel safe going back to the place where you live?: Yes      Need for Family Participation in Patient Care: Yes (Comment) Care giver support system in place?: Yes (comment)   Criminal Activity/Legal Involvement Pertinent to Current Situation/Hospitalization: No - Comment as needed  Activities of Daily Living      Permission Sought/Granted Permission sought to share  information with : Case Manager, Customer service manager, Family Supports Permission granted to share information with : Yes, Verbal Permission Granted  Share Information with NAME: Enid Derry  Permission granted to share info w AGENCY: SNFs/Palliative  Permission granted to share info w Relationship: daughter  Permission granted to share info w Contact Information: [phone]  Emotional Assessment Appearance:: Appears stated age     Orientation: : Oriented to Place, Oriented to Self Alcohol / Substance Use: Not Applicable Psych Involvement: No (comment)  Admission diagnosis:  Dehydration [E86.0] Altered mental status [R41.82] Fatigue [R53.83] AKI (acute kidney injury) ([place]) [N17.9] Seizure ([place]) [R56.9] Patient Active Problem List   Diagnosis Date Noted  . Palliative care by specialist   . Goals of care, counseling/discussion   . DNR (do not resuscitate)   . Dementia without behavioral disturbance ([place])   . Seizure ([place]) 07/04/2019  . Generalized weakness 07/03/2019  . Altered mental status 07/03/2019  . AKI (acute kidney injury) ([place])   . Dehydration    PCP:  Sandi Mariscal, MD Pharmacy:  No Pharmacies Listed    Social Determinants of Health (SDOH) Interventions    Readmission Risk Interventions No flowsheet data found.

## 2019-07-08 NOTE — NC FL2 (Signed)
  [place] MEDICAID FL2 LEVEL OF CARE SCREENING TOOL     IDENTIFICATION  Patient Name: Judy Aguilar Birthdate: 09-28-1926 Sex: female Admission Date (Current Location): 07/03/2019  [facility] and [place] Number:  Herbalist and Address:  The . [facility] [place] [address] [place], [place] [address]      Provider Number: [id number]  Attending Physician Name and Address:  Zenia Resides, MD  Relative Name and Phone Number:  Daughter Enid Derry [phone]    Current Level of Care: Hospital Recommended Level of Care: [place] Prior Approval Number:    Date Approved/Denied:   PASRR Number: [id number]  Discharge Plan: SNF    Current Diagnoses: Patient Active Problem List   Diagnosis Date Noted  . Palliative care by specialist   . Goals of care, counseling/discussion   . DNR (do not resuscitate)   . Dementia without behavioral disturbance ([place])   . Seizure ([place]) 07/04/2019  . Generalized weakness 07/03/2019  . Altered mental status 07/03/2019  . AKI (acute kidney injury) ([place])   . Dehydration     Orientation RESPIRATION BLADDER Height & Weight     Self, Place  Normal Incontinent, External catheter Weight: 141 lb 1.6 oz (64 kg) Height:  5' (152.4 cm)(deom 01/2019 encounter)  BEHAVIORAL SYMPTOMS/MOOD NEUROLOGICAL BOWEL NUTRITION STATUS      Continent Diet(see discharge summary)  AMBULATORY STATUS COMMUNICATION OF NEEDS Skin   Extensive Assist Verbally Other (Comment), Skin abrasions(abrasions on arms and legs, cracking on heels)                       Personal Care Assistance Level of Assistance  Feeding, Total care, Dressing, Bathing Bathing Assistance: Maximum assistance Feeding assistance: Maximum assistance Dressing Assistance: Maximum assistance Total Care Assistance: Maximum assistance   Functional Limitations Info  Sight, Speech, Hearing Sight Info: Impaired Hearing Info: Adequate Speech Info: Adequate     SPECIAL CARE FACTORS FREQUENCY  PT (By licensed PT), OT (By licensed OT)     PT Frequency: min 5x weekly OT Frequency: min 5x weekly            Contractures Contractures Info: Not present    Additional Factors Info  Code Status, Allergies Code Status Info: DNR Allergies Info: Lisinopril, Salicylates           Current Medications (07/08/2019):  This is the current hospital active medication list Current Facility-Administered Medications  Medication Dose Route Frequency Provider Last Rate Last Admin  . divalproex (DEPAKOTE SPRINKLE) capsule 250 mg  250 mg Oral Q8H Hensel, Jamal Collin, MD   250 mg at 07/08/19 0552  . enoxaparin (LOVENOX) injection 30 mg  30 mg Subcutaneous Q24H Carollee Leitz, MD   30 mg at 07/08/19 6144  . feeding supplement (ENSURE ENLIVE) (ENSURE ENLIVE) liquid 237 mL  237 mL Oral TID BM Mullis, Kiersten P, DO   237 mL at 07/07/19 2019  . multivitamin with minerals tablet 1 tablet  1 tablet Oral Daily Zenia Resides, MD   1 tablet at 07/07/19 1059  . nystatin (MYCOSTATIN) 100000 UNIT/ML suspension 500,000 Units  5 mL Oral QID Carollee Leitz, MD   500,000 Units at 07/07/19 2018     Discharge Medications: Please see discharge summary for a list of discharge medications.  Relevant Imaging Results:  Relevant Lab Results:   Additional Information [phone]  Alberteen Sam, LCSW

## 2019-07-08 NOTE — Care Management Important Message (Signed)
Important Message  Patient Details  Name: Judy Aguilar MRN: [id number] Date of Birth: Aug 01, 1926   Medicare Important Message Given:  Yes     Shelda Altes 07/08/2019, 9:48 AM

## 2019-07-08 NOTE — Progress Notes (Signed)
Patient following more commands to eat and drink magic cup milk and juice however refused the main entree.

## 2019-07-09 DIAGNOSIS — E860 Dehydration: Secondary | ICD-10-CM | POA: Diagnosis not present

## 2019-07-09 DIAGNOSIS — F0390 Unspecified dementia without behavioral disturbance: Secondary | ICD-10-CM | POA: Diagnosis not present

## 2019-07-09 DIAGNOSIS — N179 Acute kidney failure, unspecified: Secondary | ICD-10-CM | POA: Diagnosis not present

## 2019-07-09 DIAGNOSIS — R4182 Altered mental status, unspecified: Secondary | ICD-10-CM | POA: Diagnosis not present

## 2019-07-09 LAB — GLUCOSE, CAPILLARY: Glucose-Capillary: 77 mg/dL (ref 70–99)

## 2019-07-09 LAB — CBC
HCT: 30.8 % — ABNORMAL LOW (ref 36.0–46.0)
Hemoglobin: 10 g/dL — ABNORMAL LOW (ref 12.0–15.0)
MCH: 28.7 pg (ref 26.0–34.0)
MCHC: 32.5 g/dL (ref 30.0–36.0)
MCV: 88.3 fL (ref 80.0–100.0)
Platelets: 172 10*3/uL (ref 150–400)
RBC: 3.49 MIL/uL — ABNORMAL LOW (ref 3.87–5.11)
RDW: 15.9 % — ABNORMAL HIGH (ref 11.5–15.5)
WBC: 11.1 10*3/uL — ABNORMAL HIGH (ref 4.0–10.5)
nRBC: 0 % (ref 0.0–0.2)

## 2019-07-09 LAB — BASIC METABOLIC PANEL
Anion gap: 10 (ref 5–15)
BUN: 44 mg/dL — ABNORMAL HIGH (ref 8–23)
CO2: 26 mmol/L (ref 22–32)
Calcium: 8.5 mg/dL — ABNORMAL LOW (ref 8.9–10.3)
Chloride: 107 mmol/L (ref 98–111)
Creatinine, Ser: 2.03 mg/dL — ABNORMAL HIGH (ref 0.44–1.00)
GFR calc Af Amer: 24 mL/min — ABNORMAL LOW (ref >60)
GFR calc non Af Amer: 21 mL/min — ABNORMAL LOW (ref >60)
Glucose, Bld: 82 mg/dL (ref 70–99)
Potassium: 3.6 mmol/L (ref 3.5–5.1)
Sodium: 143 mmol/L (ref 135–145)

## 2019-07-09 MED ORDER — ADULT MULTIVITAMIN W/MINERALS CH: 1.0000 | ORAL_TABLET | Freq: Every day | ORAL | Status: AC

## 2019-07-09 MED ORDER — METOPROLOL SUCCINATE ER 25 MG PO TB24: 25.0000 mg | ORAL_TABLET | Freq: Every day | ORAL | 0 refills | Status: AC

## 2019-07-09 MED ORDER — NYSTATIN 100000 UNIT/ML MT SUSP: 5.0000 mL | Freq: Four times a day (QID) | OROMUCOSAL | 0 refills | Status: AC

## 2019-07-09 MED ORDER — ENSURE ENLIVE PO LIQD: 237.0000 mL | Freq: Three times a day (TID) | ORAL | 12 refills | Status: AC

## 2019-07-09 NOTE — TOC Transition Note (Signed)
Transition of Care [facility]) - CM/SW Discharge Note   Patient Details  Name: Judy Aguilar MRN: [id number] Date of Birth: 1927/02/09  Transition of Care [facility]) CM/SW Contact:  Kirstie Peri, [place] Work Phone Number: 07/09/2019, 4:15 PM   Clinical Narrative:    Nurse to call report to [phone]. Rm 160   Final next level of care: Skilled Nursing Facility Barriers to Discharge: Barriers Resolved   Patient Goals and CMS Choice   CMS Medicare.gov Compare Post Acute Care list provided to:: Patient Represenative (must comment)(daughter Enid Derry) Choice offered to / list presented to : Adult Children  Discharge Placement              Patient chooses bed at: ([facility]) Patient to be transferred to facility by: [place] Name of family member notified: Enid Derry Patient and family notified of of transfer: 07/09/19  Discharge Plan and Services     Post Acute Care Choice: [place]                               Social Determinants of Health (SDOH) Interventions     Readmission Risk Interventions No flowsheet data found.

## 2019-07-09 NOTE — Progress Notes (Signed)
Attempted to call report to RN at Receiving facility [place]. No response.

## 2019-07-09 NOTE — Progress Notes (Signed)
Occupational Therapy Treatment Patient Details Name: Judy Aguilar MRN: [id number] DOB: 07/20/1926 Today's Date: 07/09/2019    History of present illness 86 y.o. female presenting with mental decline and weakness. 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 intake over the last 2 weeks. Admitted 07/03/19 for treatment of AKI, hypokalemia, leukocytosis, anemia and severe HFrEF. Incidental finding of mildly displaced R anterior 9th rib fx    OT comments  Pt progressing to OOB mobility modA +1 for stand pivot. Pt performing light grooming at EOB with minA overall. Pt continues to require increased assist for ADL/mobility. Pt continues to benefit from continued OT skilled services for ADL, mobility and safety. OT following acutely.     Follow Up Recommendations  SNF;Supervision/Assistance - 24 hour    Equipment Recommendations  None recommended by OT    Recommendations for Other Services      Precautions / Restrictions Precautions Precautions: Fall Restrictions Weight Bearing Restrictions: No       Mobility Bed Mobility Overal bed mobility: Needs Assistance Bed Mobility: Supine to Sit     Supine to sit: Min assist;HOB elevated     General bed mobility comments: for trunk elevation  Transfers Overall transfer level: Needs assistance Equipment used: 1 person hand held assist Transfers: Sit to/from Stand Sit to Stand: Mod assist Stand pivot transfers: Mod assist       General transfer comment: sit to stand with modA for power up; front to front for stand pivot    Balance Overall balance assessment: Needs assistance Sitting-balance support: Feet  supported;No upper extremity supported;Bilateral upper extremity supported;Single extremity supported Sitting balance-Leahy Scale: Poor     Standing balance support: Bilateral upper extremity supported;During functional activity Standing balance-Leahy Scale: Zero                             ADL either performed or assessed with clinical judgement   ADL Overall ADL's : Needs assistance/impaired     Grooming: Minimal assistance;Sitting Grooming Details (indicate cue type and reason): Pt assist to hold wash cloth to face at first and then pt able. Pt shaking head "no" for additional grooming tasks                             Functional mobility during ADLs: Moderate assistance General ADL Comments: Pt overall increased assist for ADL tasks and maxA for +1 stand pivot. Pt taking steps from bed to recliner today with pt holding onto therapist front to front     Vision       Perception     Praxis      Cognition Arousal/Alertness: Lethargic Behavior During Therapy: Flat affect Overall Cognitive Status: History of cognitive impairments - at baseline                                 General Comments: h/o dementia at baseline. Pt appeared to follow commands and willing to get OOB today,        Exercises     Shoulder Instructions  General Comments VSS on RA.    Pertinent Vitals/ Pain       Pain Assessment: No/denies pain Faces Pain Scale: No hurt  Home Living                                          Prior Functioning/Environment              Frequency  Min 2X/week        Progress Toward Goals  OT Goals(current goals can now be found in the care plan section)  Progress towards OT goals: Progressing toward goals  Acute Rehab OT Goals Patient Stated Goal: unable to state OT Goal Formulation: With patient Time For Goal Achievement: 07/18/19 Potential to Achieve Goals: Good ADL Goals Pt Will  Perform Grooming: with min assist;standing Pt Will Perform Lower Body Bathing: with min assist;sit to/from stand;sitting/lateral leans;with adaptive equipment Pt Will Perform Lower Body Dressing: with min assist;sitting/lateral leans;sit to/from stand;with adaptive equipment Pt Will Transfer to Toilet: with min assist;stand pivot transfer;bedside commode Additional ADL Goal #1: Pt will complete 3 step BADL with min VC's  Plan Discharge plan remains appropriate    Co-evaluation                 AM-PAC OT "6 Clicks" Daily Activity     Outcome Measure   Help from another person eating meals?: A Little Help from another person taking care of personal grooming?: A Little Help from another person toileting, which includes using toliet, bedpan, or urinal?: A Lot Help from another person bathing (including washing, rinsing, drying)?: Total Help from another person to put on and taking off regular upper body clothing?: A Lot Help from another person to put on and taking off regular lower body clothing?: Total 6 Click Score: 12    End of Session Equipment Utilized During Treatment: Gait belt  OT Visit Diagnosis: Unsteadiness on feet (R26.81);Muscle weakness (generalized) (M62.81);Other symptoms and signs involving cognitive function   Activity Tolerance Patient tolerated treatment well   Patient Left in chair;with call bell/phone within reach;with chair alarm set   Nurse Communication Mobility status        Time: 8638-1771 OT Time Calculation (min): 24 min  Charges: OT General Charges $OT Visit: 1 Visit OT Treatments $Self Care/Home Management : 8-22 mins $Therapeutic Activity: 8-22 mins  Jefferey Pica, OTR/L Acute Rehabilitation Services Pager: [phone] Office: [phone]    Ifeoma Vallin C 07/09/2019, 4:40 PM

## 2019-07-09 NOTE — Progress Notes (Signed)
  Speech Language Pathology Treatment: Dysphagia  Patient Details Name: Judy Aguilar MRN: [id number] DOB: [birth year] Today's Date: 07/09/2019 Time: 9480-1655 SLP Time Calculation (min) (ACUTE ONLY): 14 min  Assessment / Plan / Recommendation Clinical Impression  Pt was seen for skilled ST targeting diet tolerance and diagnostic treatment.  Pt was encountered awake/alert, slid down in the recliner chair.  SLP assisted RN with moving pt to an upright position in the recliner.  RN reported that pt had decreased PO intake.  Pt stated that she was tolerating her diet without coughing, choking, or difficulty.  Pt was seen with trials of thin liquid via straw sip, puree, and ground solids.  Pt took large serial sips of thin liquid, but she took very small bites of puree and ground solids (approximately 1/4 tsp per bite).  Mastication of ground solids was prolonged and trace oral residue was observed following swallow initiation.  Pt cleared residue with a cued liquid wash.  No overt s/sx of aspiration were observed with any trials.  Recommend continuation of Dysphagia 1 (puree) solids and thin liquids with medications administer crushed in puree.  Pt will benefit from full supervision to help with self feeding and to cue for compensatory strategies.     HPI HPI: Pt is a 86 y/o female with PMH of Vit D deficiency, PAD, OSA, HLD, HTN, Anemia, recent admission for CVA and possible seizure, who was brought in for progressive weakness. CT of the head was negative for acute changes. CXR negative for consolidation.       SLP Plan  Continue with current plan of care       Recommendations  Diet recommendations: Dysphagia 1 (puree);Thin liquid Liquids provided via: Straw;Cup Medication Administration: Crushed with puree Supervision: Staff to assist with self feeding;Full supervision/cueing for compensatory strategies;Trained caregiver to feed patient Compensations: Slow rate;Small sips/bites;Minimize  environmental distractions;Follow solids with liquid Postural Changes and/or Swallow Maneuvers: Seated upright 90 degrees                Oral Care Recommendations: Oral care BID;Staff/trained caregiver to provide oral care Follow up Recommendations: Skilled Nursing facility SLP Visit Diagnosis: Dysphagia, oral phase (R13.11) Plan: Continue with current plan of care       [place]., M.S., [place] Office: [phone]  [place] 07/09/2019, 3:17 PM

## 2019-07-09 NOTE — Progress Notes (Signed)
Family Medicine Teaching Service Daily Progress Note Intern Pager: [phone]  Patient name: Judy Aguilar Medical record number: [id number] Date of birth: 07-21-1926 Age: 86 y.o. Gender: female  Primary Care Provider: Sandi Mariscal, MD Consultants: None Code Status: DNR  Pt Overview and Major Events to Date:  04/14-Admitted  Assessment and Plan: Judy Aguilar is a 86 y.o. female presenting with mental decline and weakness. 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   Generalized weakness, progressive cognitive decline: Stable and unchanged  No reversible etiologies identified.  Palliative working with family who has elected SNF, -Palliative care on board, appreciate assistance and recommendations -Dietitian on board, appreciate care with nutritional supplementation -Dysphagia 1 diet, 1: 1 feeding -Delirium precautions -Lab holidays, every 2 days -SW for SNF placement -Ensure referral to OP palliative care on DC  CKD: stabilized CR 2.46 on 4/18.  Downtrending since admission. Baseline 2.1-3.1 -Encourage oral hydration  -BMP q2days  Severe HFrEF: Chronic, stable. EF 20-25%(2021).  Euvolemic on exam. -Daily Weights, and I&O's -Continue to hold home medications in the setting of AKI/normotension   Leukocytosis: Improving WBC 11.1 on 4/20, has down trended since admission.  Unclear etiology. -Monitor CBC every 2 days -Continue to monitor for infectious S/Sx, work-up nonrevealing thus far  Normocytic anemia: Chronic, stable. Hbg 10.1.  Baseline around 9.1 -CBC every 2 days  Hypertension: Chronic, stable. Normotensive without antihypertensives.   -Continue to hold home Amlodipine, Lopressor, Isosorbide/Hydralazine, Triamterene-HCTZ -Continue to monitor  ?Seizure activity history: No seizures  during hospitalization. -Continue Depakote 250 mg TID -Continue to monitor  OSA: Chronic, stable. Will offer CPAP nightly. -Continue to monitor  Right Rib Fracture: Stable. Right anterior 9th rib fracture, incidental finding on chest xray without significant displacement.  Patient with unlabored breathing, no associated pain.  -Continue to monitor  FEN/GI:  -Dysphagia 1, thin liquid  Prophylaxis:  -Lovenox  Disposition: Continue to monitor with palliative conversations, likely will return to SNF early next week  Subjective:  No acute events reported overnight, says she has no pain and is doing well.  Says she knows she is going to SNF    Objective: Temp:  [97.9 F (36.6 C)-98.1 F (36.7 C)] 98.1 F (36.7 C) (04/20 1139) Pulse Rate:  [77-82] 82 (04/20 1139) Resp:  [18] 18 (04/20 1139) BP: (107-125)/(59-73) 125/73 (04/20 1139) SpO2:  [100 %] 100 % (04/20 1139) Weight:  [62.2 kg] 62.2 kg (04/20 0359)  Physical Exam: General: Older female, cachectic, no acute distress Cardiac: Regular rhythm, regular rate, no murmur noted Lungs: No increased work of breathing, no cough, clear to auscultation bilaterally Abdomen: Soft, nontender Neuro: Alert and conversational, says that she is aware of plan to go to SNF Ext: Warm and dry, no significant edema noted   Laboratory: Recent Labs  Lab 07/06/19 0318 07/07/19 0750 07/09/19 0511  WBC 17.4* 17.8* 11.1*  HGB 9.1* 10.1* 10.0*  HCT 27.8* 30.8* 30.8*  PLT 192 194 172   Recent Labs  Lab 07/03/19 1740 07/04/19 0104 07/06/19 0318 07/07/19 0750 07/09/19 0511  NA  --    < > 138 140 143  K  --    < > 3.5 3.4* 3.6  CL  --    < > 100 104 107  CO2  --    < > 27 24 26   BUN  --    < >  48* 44* 44*  CREATININE  --    < > 2.79* 2.46* 2.03*  CALCIUM  --    < > 8.0* 8.4* 8.5*  PROT 7.9  --   --   --   --   BILITOT 0.7  --   --   --   --   ALKPHOS 79  --   --   --   --   ALT 11  --   --   --   --   AST 24  --   --   --   --    GLUCOSE  --    < > 80 87 82   < > = values in this interval not displayed.    Imaging/Diagnostic Tests: No results found.  Sherene Sires, DO 07/09/2019, 1:37 PM PGY-3, [place] Intern pager: [phone], text pages welcome

## 2019-07-09 NOTE — Discharge Summary (Signed)
[facility] Discharge Summary  Patient name: Judy Aguilar Medical record number: [id number] Date of birth: 02-May-1926 Age: 86 y.o. Gender: female Date of Admission: 07/03/2019  Date of Discharge: 07/09/19 Admitting Physician: Zenia Resides, MD  Primary Care Provider: Sandi Mariscal, MD Consultants: Palliative  Indication for Hospitalization: Progressive weakness and poor appetite  Discharge Diagnoses/Problem List:  Cognitive decline, progressive generalized weakness AKI on CKD stage IV Chronic normocytic anemia HFrEF with EF 20-25% Recent CVA, 04/2019, s/p thrombectomy Hypertension OSA Right mildly displaced anterior 9th rib fracture History of breast cancer, 2004 s/p mastectomy ?Seizure-like activity on Depakote since 05/2019  Disposition: SNF, [facility]    Discharge Condition: Stable  Discharge Exam:  Physical Exam  Constitutional: No distress.  Cachectic, alert but not reliably interactive, no acute distress  HENT:  Head: Normocephalic.  Eyes: Conjunctivae are normal. Right eye exhibits no discharge. Left eye exhibits no discharge.  Cardiovascular: Normal rate and intact distal pulses. Exam reveals no gallop and no friction rub.  Pulmonary/Chest: Effort normal and breath sounds normal. No respiratory distress.  Abdominal: Soft. Bowel sounds are normal.  Skin: Skin is warm and dry. She is not diaphoretic. No erythema.  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   On arrival, she was afebrile, alert and oriented to self only, but otherwise with unremarkable exam above known baseline (residual left-sided weakness, mild facial droop).  CT head 4/14 without acute intracranial abnormality, recent MRI brain on 3/30 showing mildly progressed severe small vessel disease since MRI in 04/2019.  EKG sinus rhythm w/o ischemic features, no recent CP/DOE. Covid neg. Urine culture no growth.  CXR without infiltrates, but small chronic pleural effusions.  Valproic level WNL without concern for seizure-like activity, continued on depakote. Initial labs showing K 2.9 and CR 3.8 (BL around 2-2.5), felt to be 2/2 dehydration with her poor p.o. intake.  WBC 22.3 and down trended during admission (** on d/c), unclear etiology for leukocytosis without focal infectious findings, antibiotics were not initiated. Hgb at baseline with no evidence of bleeding. She was provided IV fluid resuscitation, nutritional supplementation via dietician (dysphagia diet), and electrolyte replacement with improvement in mental status.    Ultimately, were not able to identify any additional reversible causes of her presentation (other than nutritional) and felt that it was most likely decline in the setting of age and numerous comorbid conditions. Palliative care was consulted and discussed goals of care with family and patient.  Established DNR/DNI and no feeding tubes.  Realistic expectations were set with family and wanted to proceed with continue monitoring at SNF for increasing strength, but understanding that hospice care may be indicated in the future. At discharge, her cognition was improved (A&O X2), however with continued generalized weakness.   Issues for Follow Up:  1. Please continue to monitor mental status and po intake, recommend continuing Ensure shakes BID as she has enjoyed these during hospitalization.  Outpatient palliative care referral placed. 2. Her heart  failure/hypertension medications were held during hospitalization (Norvasc, BiDil, Lopressor, Lasix, Dyazide) due  to lower blood pressures, AKI, and dehydration.  Recommend monitoring BP and volume status, restart as appropriate. 3. Monitor BMP.  AKI resolved with hydration, CR 2.03 on discharge around baseline. 4. Please follow-up Zio patch results from recent hospitalization at [facility], this was an investigation for etiology of cryptogenic stroke and recent hospitalization for brief episode of nonresponsiveness.  No concerning arrhythmias while on telemetry during this admission.  Significant Procedures: None  Significant Labs and Imaging:  Recent Labs  Lab 07/06/19 0318 07/07/19 0750 07/09/19 0511  WBC 17.4* 17.8* 11.1*  HGB 9.1* 10.1* 10.0*  HCT 27.8* 30.8* 30.8*  PLT 192 194 172   Recent Labs  Lab 07/03/19 1022 07/03/19 1105 07/03/19 1740 07/03/19 1820 07/04/19 0104 07/04/19 0104 07/05/19 0436 07/05/19 0436 07/06/19 0318 07/06/19 0318 07/07/19 0750 07/09/19 0511  NA   < >  --   --   --  139  --  143  --  138  --  140 143  K   < >  --   --   --  3.8   < > 3.3*   < > 3.5   < > 3.4* 3.6  CL   < >  --   --   --  97*  --  100  --  100  --  104 107  CO2   < >  --   --   --  27  --  26  --  27  --  24 26  GLUCOSE   < >  --   --   --  71  --  61*  --  80  --  87 82  BUN   < >  --   --   --  64*  --  56*  --  48*  --  44* 44*  CREATININE   < >  --   --   --  3.52*  --  3.19*  --  2.79*  --  2.46* 2.03*  CALCIUM   < >  --   --   --  8.3*  --  8.5*  --  8.0*  --  8.4* 8.5*  MG  --  2.6*  --   --  2.5*  --  2.5*  --   --   --  2.4  --   PHOS  --   --   --  3.9  --   --   --   --   --   --   --   --   ALKPHOS  --   --  79  --   --   --   --   --   --   --   --   --   AST  --   --  24  --   --   --   --   --   --   --   --   --   ALT  --   --  11  --   --   --   --   --   --   --   --   --   ALBUMIN  --   --  2.6*  --   --   --   --   --   --   --   --   --    < > = values in this  interval not displayed.   CXR IMPRESSION:  1. Cardiomegaly with small chronic pleural effusions. But resolved interstitial edema since February. 2. Mildly displaced right anterior 9th rib fracture. 3.  Aortic Atherosclerosis (ICD10-I70.0).  CT Head:  IMPRESSION: 1. No acute intracranial abnormality. If there is persisting clinical concern for acute infarction, MRI is more sensitive and specific for early features of ischemia. 2. Numerous remote appearing lacunar infarcts in the bilateral basal ganglia are similar to comparison exam. 3. Advanced parenchymal volume loss and chronic microvascular angiopathy.  Results/Tests Pending at Time of Discharge:   Discharge Medications:  Allergies as of 07/09/2019      Reactions   Lisinopril    Other reaction(s): Cough (ALLERGY/intolerance)   Salicylates Nausea And Vomiting      Medication List    STOP taking these medications   amLODipine 5 MG tablet Commonly known as: NORVASC   furosemide 40 MG tablet Commonly known as: LASIX   gabapentin 600 MG tablet Commonly known as: NEURONTIN   hydrALAZINE 10 MG tablet Commonly known as: APRESOLINE   isosorbide dinitrate 30 MG tablet Commonly known as: ISORDIL   metoprolol tartrate 50 MG tablet Commonly known as: LOPRESSOR   rosuvastatin 20 MG tablet Commonly known as: CRESTOR   triamterene-hydrochlorothiazide 37.5-25 MG capsule Commonly known as: DYAZIDE     TAKE these medications   acetaminophen 325 MG tablet Commonly known as: TYLENOL Take 650 mg by mouth every 6 (six) hours as needed for mild pain.   clopidogrel 75 MG tablet Commonly known as: PLAVIX Take 75 mg by mouth daily.   divalproex 250 MG DR tablet Commonly known as: Depakote Take 1 tablet (250 mg total) by mouth 3 (three) times daily.   feeding supplement (ENSURE ENLIVE) Liqd Take 237 mLs by mouth 3 (three) times daily between meals.   loratadine 10 MG tablet Commonly known as: CLARITIN Take 10 mg daily  by mouth.   metoprolol succinate 25 MG 24 hr tablet Commonly known as: Toprol XL Take 1 tablet (25 mg total) by mouth daily.   montelukast 10 MG tablet Commonly known as: SINGULAIR Take 10 mg by mouth daily.   multivitamin with minerals Tabs tablet Take 1 tablet by mouth daily. Start taking on: July 10, 2019   nystatin 100000 UNIT/ML suspension Commonly known as: MYCOSTATIN Take 5 mLs (500,000 Units total) by mouth 4 (four) times daily for 7 days.   pantoprazole 40 MG tablet Commonly known as: PROTONIX Take 40 mg by mouth daily.       Discharge Instructions: Please refer to Patient Instructions section of EMR for full details.  Patient was counseled important signs and symptoms that should prompt return to medical care, changes in medications, dietary instructions, activity restrictions, and follow up appointments.   Follow-Up Appointments:   Sherene Sires, DO 07/09/2019, 3:32 PM PGY-3, [place]

## 2019-12-20 DEATH — deceased

## 2021-03-09 IMAGING — MR MR HEAD W/O CM
6 of 11 series · 24 of 48 positions shown · non-contrast
Comparison: Head CT 06/18/2019 and MRI 10/13/2018

CLINICAL DATA: Aphasia and right facial droop.

EXAM:
MRI HEAD WITHOUT CONTRAST
TECHNIQUE: Multiplanar, multiecho pulse sequences of the brain and surrounding
structures were obtained without intravenous contrast.

[Series 2: DWI · axial · 3.0mm · 0.94mm/px · z∈[-84,+61]mm · 7 of 100 slices shown (1 of 2)]
[im 1/100]
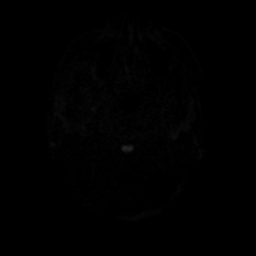
[im 17/100]
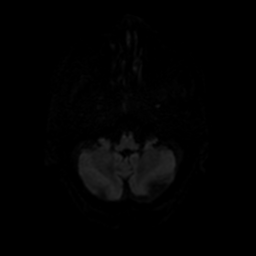
[im 34/100]
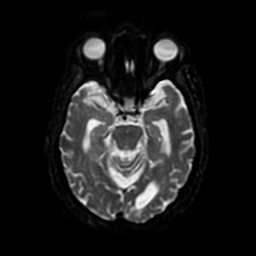
[im 50/100]
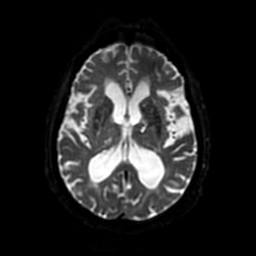
[im 67/100]
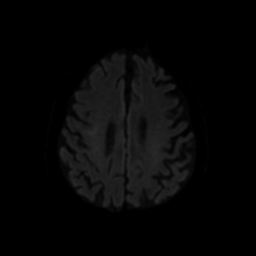
[im 83/100]
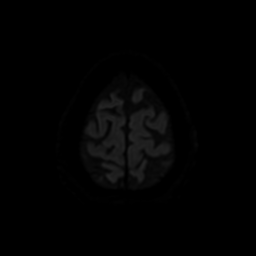
[im 100/100]
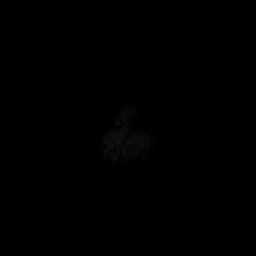

[Series 3: DWI · coronal · 4.0mm · 0.94mm/px · 6 of 76 slices shown (2 of 2)]
[im 1/76]
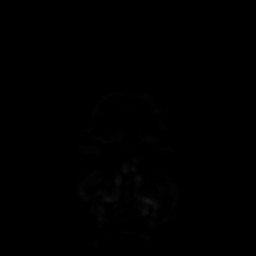
[im 16/76]
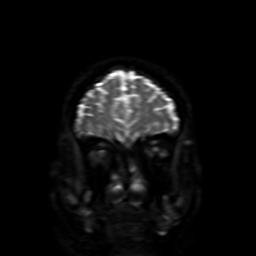
[im 31/76]
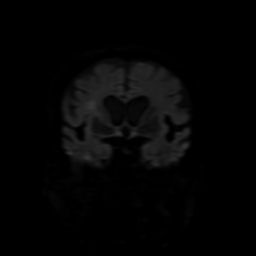
[im 46/76]
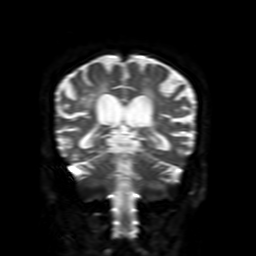
[im 61/76]
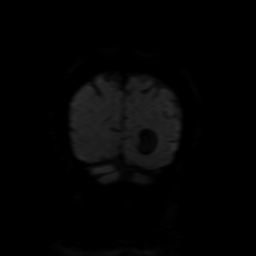
[im 76/76]
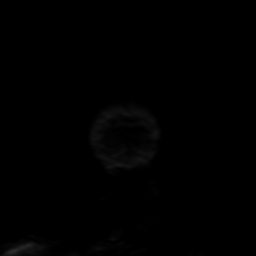

[Series 4: FLAIR · sagittal · 5.0mm · 0.23mm/px · 2 of 26 slices shown (1 of 2)]
[im 1/26]
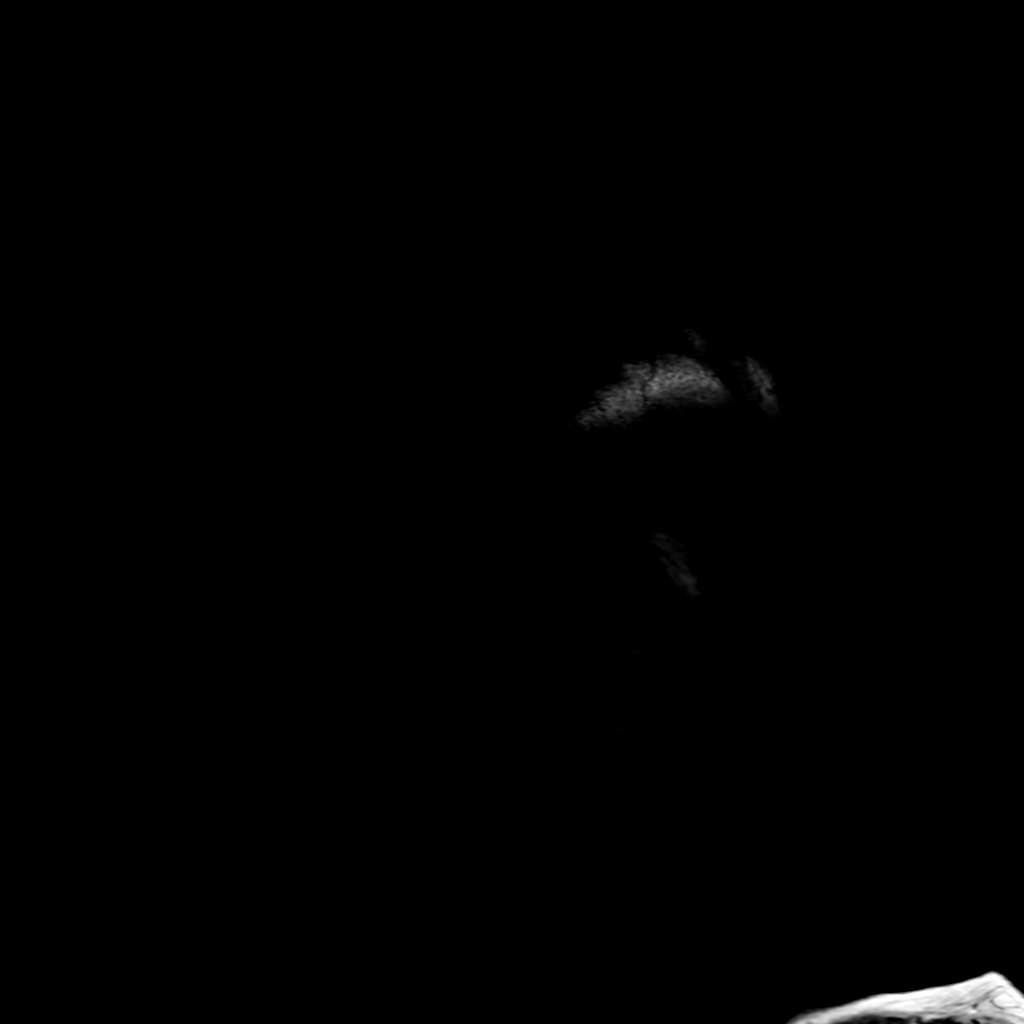
[im 26/26]
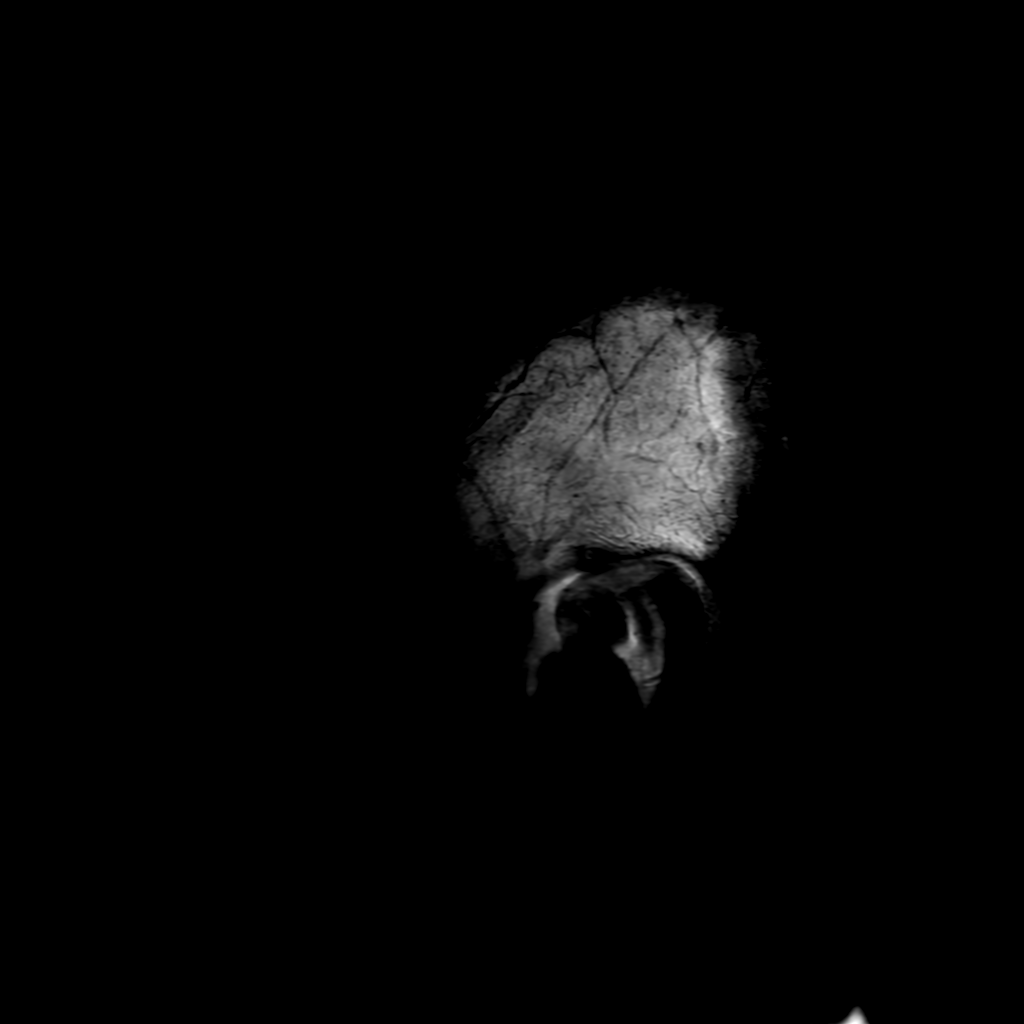

[Series 6: FLAIR · axial · 3.0mm · 0.41mm/px · z∈[-81,+68]mm · 2 of 26 slices shown (2 of 2)]
[im 1/26]
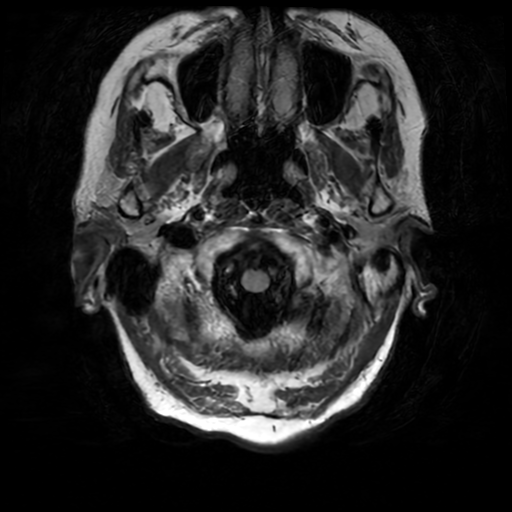
[im 26/26]
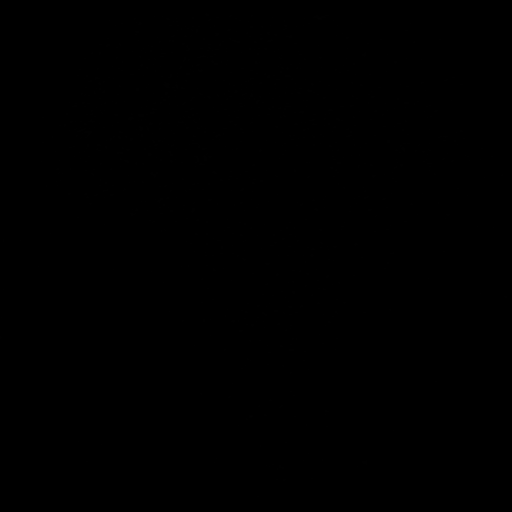

[Series 250: ADC · axial · 3.0mm · 0.94mm/px · z∈[-84,+61]mm · 4 of 50 slices shown (1 of 2)]
[im 1/50]
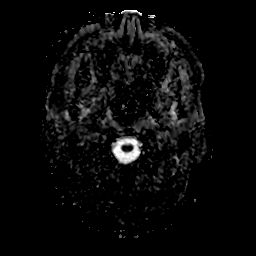
[im 17/50]
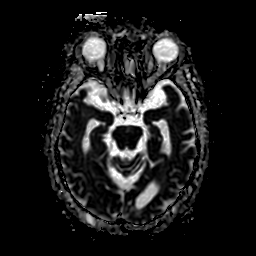
[im 33/50]
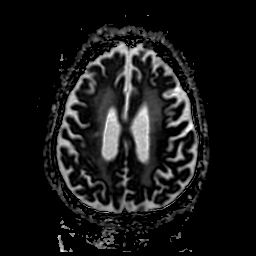
[im 50/50]
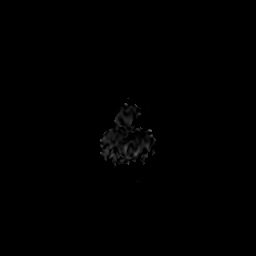

[Series 350: ADC · coronal · 4.0mm · 0.94mm/px · 3 of 38 slices shown (2 of 2)]
[im 1/38]
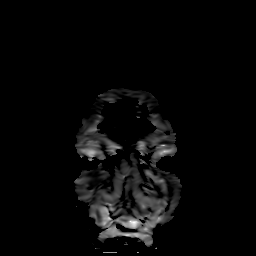
[im 19/38]
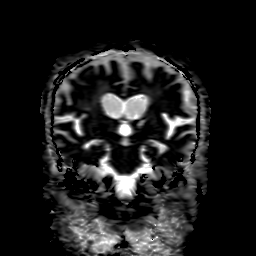
[im 38/38]
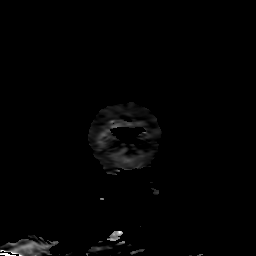

[24 of 48 positions shown; findings below may reference images not displayed]

FINDINGS: Brain: There is new patchy trace diffusion weighted signal
hyperintensity in the right corona radiata with normal to increased
ADC most consistent with nonacute ischemia, and there are discrete
foci of encephalomalacia in this region consistent with interval
chronic infarcts. No definite acute infarct is identified. There are
2 chronic microhemorrhages medially in the right parietal lobe, 1 of
which is new from the prior MRI. A chronic microhemorrhage in the
right frontal operculum is unchanged.

A chronic hemorrhagic infarct is again seen involving the left
thalamus and internal capsule. There also small chronic infarcts in
the right thalamus and left occipital lobe. Confluent T2
hyperintensities in the cerebral white matter bilaterally have
mildly progressed from the prior MRI and are nonspecific but
compatible with severe chronic small vessel ischemic disease. Mild
chronic small vessel ischemia is noted in the pons. There is
moderately advanced central predominant cerebral atrophy. No mass,
midline shift, or extra-axial fluid collection is identified. A
chronically expanded partially empty sella is unchanged from the
prior MRI.

Vascular: Major intracranial vascular flow voids are preserved.

Skull and upper cervical spine: Unremarkable bone marrow signal.

Sinuses/Orbits: Bilateral cataract extraction. Paranasal sinuses and
mastoid air cells are clear.

Other: None.
IMPRESSION: 1. No acute intracranial abnormality.
2. Severe chronic small vessel ischemic disease, mildly progressed
from the prior MRI including interval infarcts in the right corona
radiata.
